# Patient Record
Sex: Female | Born: 1935
Health system: Southern US, Community
[De-identification: ages and names within clinical notes are randomized; demographics above are authoritative.]

## PROBLEM LIST (undated history)

## (undated) DIAGNOSIS — I7300 Raynaud's syndrome without gangrene: Secondary | ICD-10-CM

## (undated) DIAGNOSIS — K219 Gastro-esophageal reflux disease without esophagitis: Secondary | ICD-10-CM

## (undated) DIAGNOSIS — R911 Solitary pulmonary nodule: Secondary | ICD-10-CM

## (undated) DIAGNOSIS — R55 Syncope and collapse: Secondary | ICD-10-CM

## (undated) DIAGNOSIS — G4733 Obstructive sleep apnea (adult) (pediatric): Secondary | ICD-10-CM

## (undated) DIAGNOSIS — I10 Essential (primary) hypertension: Secondary | ICD-10-CM

## (undated) DIAGNOSIS — E785 Hyperlipidemia, unspecified: Secondary | ICD-10-CM

## (undated) HISTORY — DX: Syncope and collapse: R55

## (undated) HISTORY — PX: ABDOMINAL HYSTERECTOMY: SHX81

## (undated) HISTORY — DX: Essential (primary) hypertension: I10

## (undated) HISTORY — DX: Gastro-esophageal reflux disease without esophagitis: K21.9

## (undated) HISTORY — PX: APPENDECTOMY: SHX54

## (undated) HISTORY — PX: ESOPHAGEAL DILATION: SHX303

## (undated) HISTORY — DX: Hyperlipidemia, unspecified: E78.5

## (undated) HISTORY — DX: Raynaud's syndrome without gangrene: I73.00

## (undated) HISTORY — DX: Solitary pulmonary nodule: R91.1

## (undated) HISTORY — DX: Obstructive sleep apnea (adult) (pediatric): G47.33

## (undated) HISTORY — PX: COLON SURGERY: SHX602

---
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
## 1959-05-02 HISTORY — PX: HEMORRHOID SURGERY: SHX153

## 1968-05-01 HISTORY — PX: KNEE ARTHROSCOPY: SUR90

## 1986-05-01 HISTORY — PX: BREAST BIOPSY: SHX20

## 1999-05-02 HISTORY — PX: UVULOPALATOPHARYNGOPLASTY: SHX827

## 2004-06-28 ENCOUNTER — Ambulatory Visit: Payer: Self-pay | Admitting: Internal Medicine

## 2005-03-08 ENCOUNTER — Emergency Department: Payer: Self-pay | Admitting: Emergency Medicine

## 2005-05-01 HISTORY — PX: PATELLA FRACTURE SURGERY: SHX735

## 2005-06-07 ENCOUNTER — Ambulatory Visit: Payer: Self-pay | Admitting: Internal Medicine

## 2005-07-19 ENCOUNTER — Other Ambulatory Visit: Payer: Self-pay

## 2005-07-19 ENCOUNTER — Inpatient Hospital Stay: Payer: Self-pay | Admitting: Specialist

## 2005-07-24 ENCOUNTER — Other Ambulatory Visit: Payer: Self-pay

## 2005-10-04 ENCOUNTER — Ambulatory Visit: Payer: Self-pay | Admitting: Internal Medicine

## 2006-06-07 ENCOUNTER — Inpatient Hospital Stay: Payer: Self-pay | Admitting: Internal Medicine

## 2006-06-07 ENCOUNTER — Other Ambulatory Visit: Payer: Self-pay

## 2006-10-08 ENCOUNTER — Ambulatory Visit: Payer: Self-pay | Admitting: Internal Medicine

## 2007-01-10 ENCOUNTER — Ambulatory Visit: Payer: Self-pay | Admitting: Internal Medicine

## 2007-01-29 ENCOUNTER — Ambulatory Visit: Payer: Self-pay | Admitting: Gastroenterology

## 2007-07-12 ENCOUNTER — Encounter: Payer: Self-pay | Admitting: Internal Medicine

## 2007-10-10 ENCOUNTER — Encounter: Payer: Self-pay | Admitting: Internal Medicine

## 2007-10-10 ENCOUNTER — Ambulatory Visit: Payer: Self-pay | Admitting: Internal Medicine

## 2008-08-26 ENCOUNTER — Encounter: Payer: Self-pay | Admitting: Internal Medicine

## 2008-10-12 ENCOUNTER — Encounter: Payer: Self-pay | Admitting: Internal Medicine

## 2008-12-11 ENCOUNTER — Encounter: Payer: Self-pay | Admitting: Internal Medicine

## 2009-03-05 ENCOUNTER — Encounter: Payer: Self-pay | Admitting: Internal Medicine

## 2009-04-07 ENCOUNTER — Encounter: Payer: Self-pay | Admitting: Internal Medicine

## 2009-09-07 ENCOUNTER — Encounter: Payer: Self-pay | Admitting: Internal Medicine

## 2009-09-13 ENCOUNTER — Encounter: Payer: Self-pay | Admitting: Internal Medicine

## 2009-09-13 ENCOUNTER — Ambulatory Visit: Payer: Self-pay | Admitting: Internal Medicine

## 2009-10-08 ENCOUNTER — Ambulatory Visit: Payer: Self-pay | Admitting: Internal Medicine

## 2009-10-08 DIAGNOSIS — R42 Dizziness and giddiness: Secondary | ICD-10-CM | POA: Insufficient documentation

## 2009-10-08 DIAGNOSIS — I10 Essential (primary) hypertension: Secondary | ICD-10-CM

## 2009-10-08 DIAGNOSIS — K219 Gastro-esophageal reflux disease without esophagitis: Secondary | ICD-10-CM | POA: Insufficient documentation

## 2009-10-08 DIAGNOSIS — M810 Age-related osteoporosis without current pathological fracture: Secondary | ICD-10-CM | POA: Insufficient documentation

## 2009-10-27 ENCOUNTER — Encounter: Payer: Self-pay | Admitting: Internal Medicine

## 2009-11-08 ENCOUNTER — Ambulatory Visit: Payer: Self-pay | Admitting: Internal Medicine

## 2009-11-10 LAB — CONVERTED CEMR LAB
ALT: 20 units/L (ref 0–35)
AST: 25 units/L (ref 0–37)
Albumin: 4.3 g/dL (ref 3.5–5.2)
Alkaline Phosphatase: 66 units/L (ref 39–117)
BUN: 20 mg/dL (ref 6–23)
Basophils Absolute: 0 10*3/uL (ref 0.0–0.1)
Basophils Relative: 0.4 % (ref 0.0–3.0)
Bilirubin, Direct: 0.1 mg/dL (ref 0.0–0.3)
CO2: 29 meq/L (ref 19–32)
Calcium: 9.7 mg/dL (ref 8.4–10.5)
Chloride: 107 meq/L (ref 96–112)
Creatinine, Ser: 0.6 mg/dL (ref 0.4–1.2)
Eosinophils Absolute: 0.1 10*3/uL (ref 0.0–0.7)
Eosinophils Relative: 1.9 % (ref 0.0–5.0)
GFR calc non Af Amer: 103.88 mL/min (ref >60)
Glucose, Bld: 82 mg/dL (ref 70–99)
HCT: 40.9 % (ref 36.0–46.0)
Hemoglobin: 14.1 g/dL (ref 12.0–15.0)
Lymphocytes Relative: 16.1 % (ref 12.0–46.0)
Lymphs Abs: 1 10*3/uL (ref 0.7–4.0)
MCHC: 34.4 g/dL (ref 30.0–36.0)
MCV: 95.8 fL (ref 78.0–100.0)
Monocytes Absolute: 0.5 10*3/uL (ref 0.1–1.0)
Monocytes Relative: 8.5 % (ref 3.0–12.0)
Neutro Abs: 4.4 10*3/uL (ref 1.4–7.7)
Neutrophils Relative %: 73.1 % (ref 43.0–77.0)
Phosphorus: 3.2 mg/dL (ref 2.3–4.6)
Platelets: 217 10*3/uL (ref 150.0–400.0)
Potassium: 4.9 meq/L (ref 3.5–5.1)
RBC: 4.27 M/uL (ref 3.87–5.11)
RDW: 13.6 % (ref 11.5–14.6)
Sodium: 142 meq/L (ref 135–145)
TSH: 0.6 microintl units/mL (ref 0.35–5.50)
Total Bilirubin: 0.7 mg/dL (ref 0.3–1.2)
Total Protein: 6.2 g/dL (ref 6.0–8.3)
WBC: 6 10*3/uL (ref 4.5–10.5)

## 2009-12-25 ENCOUNTER — Emergency Department: Payer: Self-pay | Admitting: Emergency Medicine

## 2009-12-27 ENCOUNTER — Encounter: Payer: Self-pay | Admitting: Internal Medicine

## 2009-12-27 ENCOUNTER — Telehealth: Payer: Self-pay | Admitting: Internal Medicine

## 2010-03-28 ENCOUNTER — Ambulatory Visit: Payer: Self-pay | Admitting: Internal Medicine

## 2010-03-28 DIAGNOSIS — L299 Pruritus, unspecified: Secondary | ICD-10-CM | POA: Insufficient documentation

## 2010-05-11 ENCOUNTER — Other Ambulatory Visit: Payer: Self-pay | Admitting: Internal Medicine

## 2010-05-11 ENCOUNTER — Ambulatory Visit
Admission: RE | Admit: 2010-05-11 | Discharge: 2010-05-11 | Payer: Self-pay | Source: Home / Self Care | Attending: Internal Medicine | Admitting: Internal Medicine

## 2010-05-11 DIAGNOSIS — R413 Other amnesia: Secondary | ICD-10-CM | POA: Insufficient documentation

## 2010-05-11 LAB — HEPATIC FUNCTION PANEL
ALT: 23 U/L (ref 0–35)
AST: 27 U/L (ref 0–37)
Albumin: 4 g/dL (ref 3.5–5.2)
Alkaline Phosphatase: 68 U/L (ref 39–117)
Bilirubin, Direct: 0.1 mg/dL (ref 0.0–0.3)
Total Bilirubin: 0.7 mg/dL (ref 0.3–1.2)
Total Protein: 5.9 g/dL — ABNORMAL LOW (ref 6.0–8.3)

## 2010-05-11 LAB — RENAL FUNCTION PANEL
Albumin: 4 g/dL (ref 3.5–5.2)
BUN: 17 mg/dL (ref 6–23)
CO2: 29 mEq/L (ref 19–32)
Calcium: 9.8 mg/dL (ref 8.4–10.5)
Chloride: 106 mEq/L (ref 96–112)
Creatinine, Ser: 0.6 mg/dL (ref 0.4–1.2)
GFR: 110.19 mL/min (ref >60.00)
Glucose, Bld: 93 mg/dL (ref 70–99)
Phosphorus: 3.1 mg/dL (ref 2.3–4.6)
Potassium: 4.2 mEq/L (ref 3.5–5.1)
Sodium: 143 mEq/L (ref 135–145)

## 2010-05-11 LAB — CBC WITH DIFFERENTIAL/PLATELET
Basophils Absolute: 0 10*3/uL (ref 0.0–0.1)
Basophils Relative: 0.1 % (ref 0.0–3.0)
Eosinophils Absolute: 0.1 10*3/uL (ref 0.0–0.7)
Eosinophils Relative: 1.4 % (ref 0.0–5.0)
HCT: 41.5 % (ref 36.0–46.0)
Hemoglobin: 14 g/dL (ref 12.0–15.0)
Lymphocytes Relative: 10.1 % — ABNORMAL LOW (ref 12.0–46.0)
Lymphs Abs: 0.7 10*3/uL (ref 0.7–4.0)
MCHC: 33.7 g/dL (ref 30.0–36.0)
MCV: 96.9 fl (ref 78.0–100.0)
Monocytes Absolute: 0.6 10*3/uL (ref 0.1–1.0)
Monocytes Relative: 9.1 % (ref 3.0–12.0)
Neutro Abs: 5.4 10*3/uL (ref 1.4–7.7)
Neutrophils Relative %: 79.3 % — ABNORMAL HIGH (ref 43.0–77.0)
Platelets: 197 10*3/uL (ref 150.0–400.0)
RBC: 4.28 Mil/uL (ref 3.87–5.11)
RDW: 13.3 % (ref 11.5–14.6)
WBC: 6.8 10*3/uL (ref 4.5–10.5)

## 2010-05-11 LAB — TSH: TSH: 0.83 u[IU]/mL (ref 0.35–5.50)

## 2010-05-11 LAB — VITAMIN B12: Vitamin B-12: 777 pg/mL (ref 211–911)

## 2010-05-16 ENCOUNTER — Telehealth: Payer: Self-pay | Admitting: Internal Medicine

## 2010-05-31 NOTE — Letter (Signed)
Summary: [facility] Card[facility]  [facility]   Imported By: Lanelle Bal 12/07/2009 11:53:19  _____________________________________________________________________  External Attachment:    Type:   Image     Comment:   External Document  Appended Document: [facility]iology stable MVP with moderate to severe regurg

## 2010-05-31 NOTE — Assessment & Plan Note (Signed)
Summary: RASH ON ABDOMEN/DLO   Vital Signs:  Patient profile:   77 year old female Weight:      104 pounds Temp:     98.3 degrees F oral BP sitting:   118 / 60  (left arm) Cuff size:   regular  Vitals Entered By: Mervin Hack CMA [name]) (March 28, 2010 4:36 PM) CC: rash   History of Present Illness: Has rash on abdomen started almost a week ago seems to be spreading very itchy tried [place] cream  has also noted some mucus in throat--though doesn't really feel sick tried mucinex for this---noted rash after this after several days stopped the mucinex and tried claritin at suggestion of [facility]  No new clothes no new detergents  Allergies: 1)  Vioxx  Past History:  Past medical, surgical, family and social histories (including risk factors) reviewed for relevance to current acute and chronic problems.  Past Medical History: Reviewed history from 10/08/2009 and no changes required. GERD Hyperlipidemia Hypertension Osteoporosis ??TIAs --dizziness and presyncope Raynaud's phenomenon Migraine headaches--remitted now Chronic left lung nodule Obstructive sleep apnea-- UPPP done  Past Surgical History: Reviewed history from 10/08/2009 and no changes required. Appendectomy--1961 Hemorrhoidectomy--1961 Hysterectomy-- 1961 Tonsillectomy and adenoidectomy  1936 Benign breast biopsy-- 1988 Benigh ovarian tumor--1958 Right knee arthroscopy--1970 Esophageal dilation   UPPP for sleep apnea 2001 Repair of fractured right patella--2007 GI bleed due to diverticulosis/AVMs--colonoscopy 2008  Family History: Reviewed history from 10/08/2009 and no changes required. Mom died of CHF @79 . Had DM Dad committed suicide @59  1 brother living 1 sister died of Altzheimers @85  1 brother died of heart and valve trouble  ~65 No breast or colon cancer  Social History: Reviewed history from 10/08/2009 and no changes required. Widowed 1999 and 48 years of  marriage  Remarried 2007 2 children--1 son, 1 daughter Retired Charity fundraiser-- various spots, mostly doctor's offices Never Smoked Alcohol use-no. Occ wine in distant past  Has living will. Son Roney Jaffe is her health care POA. Has DNR order and wants to continue it Would not accept tube feeds  Review of Systems       no allergy problems  No fever hasn't felt sick  Physical Exam  General:  alert and normal appearance.   Nose:  mild pale congestion no inflammation Mouth:  no erythema and no exudates.   Neck:  supple and no cervical lymphadenopathy.   Skin:  no sig rash--slight redness vaguely across lower abd and into groin but no distinct rash   Impression & Recommendations:  Problem # 1:  PRURITUS (ICD-698.9) Assessment New no distinct rash but slightly red not clear if this could be related to the mucinex  will try cetirizine hydrocort cream  Complete Medication List: 1)  Aspirin 325 Mg Tabs (Aspirin) .Marland Kitchen.. 1 tab daily 2)  Omeprazole 20 Mg Tbec (Omeprazole) .... Take 1 by mouth once daily 3)  Citracal/vitamin D 250-200 Mg-unit Tabs (Calcium citrate-vitamin d) .... Take 2 by mouth two times a day 4)  Juice Plus Fibre Liqd (Nutritional supplements) .... Take 1 by mouth once daily 5)  Red Yeast Rice 600 Mg Caps (Red yeast rice extract) .... Take 1 by mouth once daily 6)  Hydrocortisone 2.5 % Crea (Hydrocortisone) .... Apply three times a day to itchy area as needed  Patient Instructions: 1)  Please try cetirizine 10mg  daily or loratadine 10mg  1-2 daily for the itching 2)  Please schedule a follow-up appointment as needed .  Prescriptions: HYDROCORTISONE 2.5 %  CREA (HYDROCORTISONE) apply three times a day to itchy area as needed  #60gm x 1   Entered and Authorized by:   [name] MD   Signed by:   [name] MD on 03/28/2010   Method used:   Electronically to        [facility]  [address] (retail)       [address], [address]       [place], [place]  [address]       Ph: [phone]       Fax: [phone]   RxID:   [id number]    Orders Added: 1)  Est. Patient Level III [10272]    Current Allergies (reviewed today): VIOXX

## 2010-05-31 NOTE — Assessment & Plan Note (Signed)
Summary: ONE MONTH FOLLOW UP / LFW   Vital Signs:  Patient profile:   77 year old female Weight:      104 pounds Temp:     97.0 degrees F tympanic Pulse rate:   64 / minute Pulse rhythm:   regular BP sitting:   130 / 70  (left arm) Cuff size:   regular  Vitals Entered By: Mervin Hack CMA [name]) (November 08, 2009 10:10 AM) CC: 1 month follow-up   History of Present Illness: Doing well Still having dizziness--"aggravating" Able to walk  on the circular track at [facility] Uses walker for stability  No vertigo Has trouble focusing--needs to cover an eye to read Eye exam by Dr Karma Greaser obvious problems  has tried meclizine for up to 10 days two times a day and no improvement    Allergies: 1)  Vioxx  Past History:  Past medical, surgical, family and social histories (including risk factors) reviewed for relevance to current acute and chronic problems.  Past Medical History: Reviewed history from 10/08/2009 and no changes required. GERD Hyperlipidemia Hypertension Osteoporosis ??TIAs --dizziness and presyncope Raynaud's phenomenon Migraine headaches--remitted now Chronic left lung nodule Obstructive sleep apnea-- UPPP done  Past Surgical History: Reviewed history from 10/08/2009 and no changes required. Appendectomy--1961 Hemorrhoidectomy--1961 Hysterectomy-- 1961 Tonsillectomy and adenoidectomy  1936 Benign breast biopsy-- 1988 Benigh ovarian tumor--1958 Right knee arthroscopy--1970 Esophageal dilation   UPPP for sleep apnea 2001 Repair of fractured right patella--2007 GI bleed due to diverticulosis/AVMs--colonoscopy 2008  Family History: Reviewed history from 10/08/2009 and no changes required. Mom died of CHF @79 . Had DM Dad committed suicide @59  1 brother living 1 sister died of Altzheimers @85  1 brother died of heart and valve trouble  ~65 No breast or colon cancer  Social History: Reviewed history from 10/08/2009 and no changes  required. Widowed 1999 and 48 years of marriage  Remarried 2007 2 children--1 son, 1 daughter Retired Charity fundraiser-- various spots, mostly doctor's offices Never Smoked Alcohol use-no. Occ wine in distant past  Has living will. Son Roney Jaffe is her health care POA. Has DNR order and wants to continue it Would not accept tube feeds  Review of Systems       appetite is usual for her Sleeping okay--- early to bed and often up  ~4AM Occ early afternoon nap weight stable  Physical Exam  General:  alert and normal appearance.   Neck:  supple, no masses, no thyromegaly, and no cervical lymphadenopathy.   Lungs:  normal respiratory effort and normal breath sounds.   Heart:  normal rate, regular rhythm, no murmur, and no gallop.   Extremities:  no edema Neurologic:  strength normal in all extremities, gait normal, and Romberg negative.   Psych:  normally interactive, good eye contact, not anxious appearing, and not depressed appearing.     Impression & Recommendations:  Problem # 1:  DIZZINESS (ICD-780.4) Assessment Unchanged continues despite no vertigo, likely to be vestibular uses walker for stability when needed  Problem # 2:  HYPERTENSION (ICD-401.9) Assessment: Unchanged  BP fine off meds  will stay off sees Dr Darrold Junker for valvular insufficiency  BP today: 130/70 Prior BP: 120/70 (10/08/2009)  Orders: TLB-Renal Function Panel (80069-RENAL) TLB-CBC Platelet - w/Differential (85025-CBCD) TLB-Hepatic/Liver Function Pnl (80076-HEPATIC) TLB-TSH (Thyroid Stimulating Hormone) (84443-TSH) Venipuncture (60454)  Complete Medication List: 1)  Aspirin 325 Mg Tabs (Aspirin) .Marland Kitchen.. 1 tab daily 2)  Omeprazole 20 Mg Tbec (Omeprazole) .... Take 1 by mouth once daily 3)  Citracal/vitamin D  250-200 Mg-unit Tabs (Calcium citrate-vitamin d) .... Take 2 by mouth two times a day 4)  Juice Plus Fibre Liqd (Nutritional supplements) .... Take 1 by mouth once daily 5)  Red Yeast Rice 600 Mg Caps  (Red yeast rice extract) .... Take 1 by mouth once daily  Patient Instructions: 1)  Please schedule a follow-up appointment in 6 months .   Current Allergies (reviewed today): VIOXX

## 2010-05-31 NOTE — Miscellaneous (Signed)
Summary: DO NOT RESUSCITATE ORDER  DO NOT RESUSCITATE ORDER   Imported By: Carin Primrose 10/08/2009 16:01:21  _____________________________________________________________________  External Attachment:    Type:   Image     Comment:   External Document

## 2010-05-31 NOTE — Letter (Signed)
Summary: Cardiology/[facility]  Cardiology/[facility]   Imported By: Sherian Rein 01/21/2010 09:47:08  _____________________________________________________________________  External Attachment:    Type:   Image     Comment:   External Document  Appended Document: Cardiology/[facility] checking stress test due to recurrent chest pain

## 2010-05-31 NOTE — Letter (Signed)
Summary: [facility]   Imported By: Lanelle Bal 10/14/2009 13:08:18  _____________________________________________________________________  External Attachment:    Type:   Image     Comment:   External Document

## 2010-05-31 NOTE — Letter (Signed)
Summary: Records Dated 07-12-07 thru 07-09-09/[facility]  Records Dated 07-12-07 thru 07-09-09/[facility]   Imported By: Lanelle Bal 10/14/2009 12:59:01  _____________________________________________________________________  External Attachment:    Type:   Image     Comment:   External Document

## 2010-05-31 NOTE — Letter (Signed)
Summary: Patient Questionnaire  Patient Questionnaire   Imported By: Beau Fanny 10/08/2009 16:19:56  _____________________________________________________________________  External Attachment:    Type:   Image     Comment:   External Document

## 2010-05-31 NOTE — Assessment & Plan Note (Signed)
Summary: NEW PT TO EST PER DR LETVAK/CLE   Vital Signs:  Patient profile:   77 year old female Height:      63 inches Weight:      103 pounds BMI:     18.31 Temp:     97.9 degrees F oral Pulse rate:   72 / minute Pulse rhythm:   regular BP sitting:   120 / 70  (left arm) Cuff size:   regular  Vitals Entered By: Mervin Hack CMA [name]) (October 08, 2009 2:28 PM) CC: new patient to establish care   History of Present Illness: Eatablishing here Had been seen at [facility] I care for her husband and have known her for some time She lives at [facility] for [facility] years---moved there with 1st husband.  HTN goes back many years controlled on meds no headaches Has had bad dizzy spells at times---hard to even sit up in bed Gets blurred vision sensation and doesn't feel right constant blurred sensation but eye exam completely normal no vertigo---trial of meclizine didn't help  Migraines in past  On plavix since possible TIAs in church no aspirin allergy though  given mirtazapine with stress of caring for demented husband also stress with daughter moving to [place] not clearly helping  history of hyperliipemia takes red yeast rice  Had dilation of esophagus suspicion of sleep problems due to reflux or esophageal problems uses the PPI reguarly  Preventive Screening-Counseling & Management  Alcohol-Tobacco     Smoking Status: never  Allergies (verified): 1)  Vioxx  Past History:  Past Medical History: GERD Hyperlipidemia Hypertension Osteoporosis ??TIAs --dizziness and presyncope Raynaud's phenomenon Migraine headaches--remitted now Chronic left lung nodule Obstructive sleep apnea-- UPPP done  Past Surgical History: Appendectomy--1961 Hemorrhoidectomy--1961 Hysterectomy-- 22-Sep-1959 Tonsillectomy and adenoidectomy  1936 Benign breast biopsy-- 09/22/1986 Benigh ovarian tumor--1958 Right knee arthroscopy--1970 Esophageal dilation   UPPP for sleep apnea  09-22-99 Repair of fractured right patella--2007 GI bleed due to diverticulosis/AVMs--colonoscopy 09-22-06  Family History: Mom died of CHF @79 . Had DM Dad committed suicide @59  1 brother living 1 sister died of Altzheimers @85  1 brother died of heart and valve trouble  ~65 No breast or colon cancer  Social History: Widowed 09/21/1997 and 48 years of marriage  Remarried 09/21/05 2 children--1 son, 1 daughter Retired Charity fundraiser-- various spots, mostly doctor's offices Never Smoked Alcohol use-no. Occ wine in distant past  Has living will. Son Roney Jaffe is her health care POA. Has DNR order and wants to continue it Would not accept tube feeds Smoking Status:  never  Review of Systems General:  weight stable wears seat belt generally sleeps okay--some recent stress and has early awakening Used to walk 3 miles per day--had to give up with husband's deterioration. Eyes:  Complains of blurring; denies double vision and vision loss-1 eye. ENT:  Complains of decreased hearing; uses aides teeth okay-- keeps up with the dentist. CV:  Complains of lightheadness; denies chest pain or discomfort, difficulty breathing at night, difficulty breathing while lying down, palpitations, and shortness of breath with exertion. Resp:  Denies cough and shortness of breath. GI:  Complains of indigestion; denies abdominal pain, bloody stools, change in bowel habits, dark tarry stools, nausea, and vomiting; eats prunes, lots of water to keep regular. GU:  Denies dysuria and incontinence. MS:  Denies joint pain and joint swelling. Derm:  Denies lesion(s) and rash. Neuro:  Denies headaches, numbness, tingling, and weakness. Psych:  Complains of anxiety and depression; some mood issues with  husband's deterioration not anhedonic. Heme:  Denies abnormal bruising and enlarge lymph nodes. Allergy:  Denies seasonal allergies and sneezing.  Physical Exam  General:  alert and normal appearance.   Eyes:  pupils equal, pupils round,  and pupils reactive to light.   Mouth:  no erythema and no exudates.   Neck:  supple, no masses, no thyromegaly, no carotid bruits, and no cervical lymphadenopathy.   Lungs:  normal respiratory effort and normal breath sounds.   Heart:  normal rate, regular rhythm, no murmur, and no gallop.   Abdomen:  soft, non-tender, and no masses.   Msk:  no joint tenderness and no joint swelling.   Pulses:  faint in feet Extremities:  no edema Neurologic:  alert & oriented X3, strength normal in all extremities, and gait normal.   Skin:  no rashes and no suspicious lesions.   Axillary Nodes:  No palpable lymphadenopathy Psych:  normally interactive, good eye contact, not anxious appearing, and not depressed appearing.     Impression & Recommendations:  Problem # 1:  DIZZINESS (ICD-780.4) Assessment New and vision changes eye exam normal history of TIA's is quite questionable  will stop the plavix and use aspirin reduce meds --esp BP and reevaluate Just had full blood panel in May and all fine (records reviewed from Dr Judithann Sheen)  Problem # 2:  HYPERTENSION (ICD-401.9) Assessment: Unchanged running low will try off--esp in view of her not feeling right  The following medications were removed from the medication list:    Amlodipine Besylate 5 Mg Tabs (Amlodipine besylate) .Marland Kitchen... Take 1 by mouth once daily  BP today: 120/70  Problem # 3:  OSTEOPOROSIS (ICD-733.00) Assessment: Comment Only spine only osteopenia in hip and wrist will stop the alendronate--on for >5 years  Her updated medication list for this problem includes:    Citracal/vitamin D 250-200 Mg-unit Tabs (Calcium citrate-vitamin d) .Marland Kitchen... Take 2 by mouth two times a day  Problem # 4:  GERD (ICD-530.81) Assessment: Unchanged will continue PPI since she needed dilation in past  Her updated medication list for this problem includes:    Omeprazole 20 Mg Tbec (Omeprazole) .Marland Kitchen... Take 1 by mouth once daily  Problem # 5:   HYPERLIPIDEMIA (ICD-272.4) Assessment: Unchanged on red yeast rice Total 279 before starting this  Complete Medication List: 1)  Aspirin 325 Mg Tabs (Aspirin) .Marland Kitchen.. 1 tab daily 2)  Omeprazole 20 Mg Tbec (Omeprazole) .... Take 1 by mouth once daily 3)  Citracal/vitamin D 250-200 Mg-unit Tabs (Calcium citrate-vitamin d) .... Take 2 by mouth two times a day 4)  Juice Plus Fibre Liqd (Nutritional supplements) .... Take 1 by mouth once daily 5)  Red Yeast Rice 600 Mg Caps (Red yeast rice extract) .... Take 1 by mouth once daily  Patient Instructions: 1)  Please schedule a follow-up appointment in 1 month.   Current Allergies (reviewed today): VIOXX    Preventive Care Screening  Mammogram:    Date:  10/02/2008    Results:  normal     Tetanus/Td Immunization History:    Tetanus/Td # 1:  Historical (07/19/2005)  Pneumovax Immunization History:    Pneumovax # 1:  Pneumovax (Medicare) (05/01/1994)   Appended Document: NEW PT TO EST PER DR LETVAK/CLE    Clinical Lists Changes  Observations: Added new observation of ZOSTAVAX: Zostavax (12/11/2008 15:58)       Immunization History:  Tetanus/Td Immunization History:    Tetanus/Td:  Historical (07/19/2005)  Pneumovax Immunization History:    Pneumovax:  Pneumovax (Medicare) (05/01/1994)  Zostavax History:    Zostavax # 1:  Zostavax (12/11/2008)  Appended Document: NEW PT TO EST PER DR LETVAK/CLE concerns about memory loss also MRI just done by Dr Georgiann Hahn chronic white matter changes Blood work all fine probably related to stress but could be mild cognitive impairment will reevaluate at next visit

## 2010-05-31 NOTE — Miscellaneous (Signed)
Summary: Shingles/[facility]  [facility]les[facility]   Imported By: Lanelle Bal 10/14/2009 13:06:46  _____________________________________________________________________  External Attachment:    Type:   Image     Comment:   External Document

## 2010-05-31 NOTE — Progress Notes (Signed)
Summary: call a nurse   Phone Note Call from Patient   Summary of Call: Triage Record Num: [id number] Operator: Meribeth Mattes Patient Name: Holly Koch Call Date & Time: 12/25/2009 7:53:18AM Patient Phone: [phone] PCP: Patient Gender: Female PCP Fax : Patient DOB: 1926-07-24 Practice Name: [facility] Reason for Call: For several days pt has been having discomfort under her left breast. Pt says that her bra feels too tight. Pt is having discomfort under breast now. Pain woke her up during the night, no diff breathing. RN advised pt to call EMS 911. Pt verbalized understanding. Protocol(s) Used: Chest Pain Recommended Outcome per Protocol: Activate EMS 911 Reason for Outcome: Pressure, fullness, squeezing sensation or pain anywhere in the chest lasting 5 or more minutes now or within the last hour. Pain is NOT associated with taking a deep breath or a productive cough, movement, or touch to a localized area on the chest. Care Advice:  ~ An adult should stay with the patient, preferably one trained in CPR. 12/25/2009 8:07:46AM Page 1 of 1 Initial call taken by: [name],  December 27, 2009 8:36 AM  Follow-up for Phone Call        Please check on her [name] MD  December 27, 2009 10:13 AM   pt states she's doing pretty good, she has appt today at 3pm with Dr.[name]. Pt states the pain comes and goes, but she's ok right now, pt states she's been "out and about" as much as possible. [name] CMA [name])  December 27, 2009 1:20 PM   Yes, her husband just died last week Follow-up by: [name] MD,  December 27, 2009 1:35 PM

## 2010-05-31 NOTE — Miscellaneous (Signed)
Summary: Do Not Resuscitate Order  Do Not Resuscitate Order   Imported By: Beau Fanny 10/11/2009 09:44:49  _____________________________________________________________________  External Attachment:    Type:   Image     Comment:   External Document

## 2010-06-02 NOTE — Progress Notes (Signed)
Summary: chest cold   Phone Note Call from Patient Call back at Home Phone [phone]   Caller: [name] with [facility]  Call For: [name] MD Summary of Call: [name] says that patient camed in to the clinic this morning complaining of a chest cold. [name] says that her lungs sound clear, has some post nasal drip, watery eyes, cough, 97.7, she is currently  takeing zyrtec daily. Patient wants to know  what she can take in addition to the zyrtec. Call patient directly at home number. Uses [facility] [address].  Initial call taken by: [name],  May 16, 2010 2:44 PM  Follow-up for Phone Call        I just recommend tylenol for the aches, etc No other meds are clearly useful for a cold If she worsens, we can get her in for a check up but doesn't sound necessary now Follow-up by: [name] MD,  May 16, 2010 5:24 PM  Additional Follow-up for Phone Call Additional follow up Details #1::        Spoke with patient and advised results.  Additional Follow-up by: Mervin Hack CMA [name]),  May 16, 2010 5:31 PM

## 2010-06-02 NOTE — Assessment & Plan Note (Signed)
Summary: FOLLOW UP / LFW   Vital Signs:  Patient profile:   77 year old female Weight:      104 pounds Temp:     98.0 degrees F oral Pulse rate:   68 / minute Pulse rhythm:   regular BP sitting:   120 / 60  (left arm) Cuff size:   regular  Vitals Entered By: Mervin Hack CMA [name]) (May 11, 2010 11:32 AM) CC: 6 month follow-up   History of Present Illness: Got the time mixed up for appt She is concerned that her memory is poor Forgets names and trouble with a variety of things Forgot date in January of son's anniversary  Still drives---never has gotten lost Does her finances--did forget 1 insurance premium, had misplaced the bill Trouble reading book---can't focus well  Still with some lightheadedness Walks okay--does 2 miles on track every morning Uses walker for stability No incontinence  No heart problems recent stress test by Dr Darrold Junker was okay He monitors leaky heart valve  Allergies: 1)  Vioxx  Past History:  Past medical, surgical, family and social histories (including risk factors) reviewed for relevance to current acute and chronic problems.  Past Medical History: Reviewed history from 10/08/2009 and no changes required. GERD Hyperlipidemia Hypertension Osteoporosis ??TIAs --dizziness and presyncope Raynaud's phenomenon Migraine headaches--remitted now Chronic left lung nodule Obstructive sleep apnea-- UPPP done  Past Surgical History: Reviewed history from 10/08/2009 and no changes required. Appendectomy--1961 Hemorrhoidectomy--1961 Hysterectomy-- 1961 Tonsillectomy and adenoidectomy  1936 Benign breast biopsy-- 1988 Benigh ovarian tumor--1958 Right knee arthroscopy--1970 Esophageal dilation   UPPP for sleep apnea 2001 Repair of fractured right patella--2007 GI bleed due to diverticulosis/AVMs--colonoscopy 2008  Family History: Reviewed history from 10/08/2009 and no changes required. Mom died of CHF @79 . Had DM Dad  committed suicide @59  1 brother living 1 sister died of Altzheimers @85  1 brother died of heart and valve trouble  ~65 No breast or colon cancer  Social History: Reviewed history from 10/08/2009 and no changes required. Widowed 1999 and 48 years of marriage  Remarried 2007 2 children--1 son, 1 daughter Retired Charity fundraiser-- various spots, mostly doctor's offices Never Smoked Alcohol use-no. Occ wine in distant past  Has living will. Son Roney Jaffe is her health care POA. Has DNR order and wants to continue it Would not accept tube feeds  Review of Systems       appetite is fine sleeps okay--tends to go to bed early and initiates well. Often awakens 1-3AM weight is stable  Physical Exam  General:  alert and normal appearance.   Neck:  supple, no masses, no thyromegaly, no carotid bruits, and no cervical lymphadenopathy.   Lungs:  normal respiratory effort, no intercostal retractions, no accessory muscle use, and normal breath sounds.   Heart:  normal rate, regular rhythm, no murmur, and no gallop.   Abdomen:  soft and non-tender.   Extremities:  no edema Neurologic:  alert & oriented X3.   Clock draw shows circle with 900 written with line at left side (I asked her to indicate 9 o'clock on it) Recall  3/3 at about 3 minutes President "Obama, ...." Psych:  normally interactive, good eye contact, not anxious appearing, and not depressed appearing.     Impression & Recommendations:  Problem # 1:  MEMORY LOSS (ICD-780.93) Assessment New  sounds like she has mild cognitive impairment recent brain MRI due to dizziness was okay will check labs closer follow up  I did approve her participation in  Wellness Program at [facility]  Orders: Venipuncture [phone]) TLB-Renal Function Panel (80069-RENAL) TLB-CBC Platelet - w/Differential (85025-CBCD) TLB-Hepatic/Liver Function Pnl (80076-HEPATIC) TLB-TSH (Thyroid Stimulating Hormone) (84443-TSH) TLB-B12, Serum-Total ONLY  (60454-U98)  Problem # 2:  HYPERTENSION (ICD-401.9) Assessment: Unchanged BP is fine  BP today: 120/60 Prior BP: 118/60 (03/28/2010)  Labs Reviewed: K+: 4.9 (11/08/2009) Creat: : 0.6 (11/08/2009)     Complete Medication List: 1)  Aspirin 325 Mg Tabs (Aspirin) .Marland Kitchen.. 1 tab daily 2)  Omeprazole 20 Mg Tbec (Omeprazole) .... Take 1 by mouth once daily 3)  Citracal/vitamin D 250-200 Mg-unit Tabs (Calcium citrate-vitamin d) .... Take 2 by mouth two times a day 4)  Juice Plus Fibre Liqd (Nutritional supplements) .... Take 1 by mouth once daily 5)  Red Yeast Rice 600 Mg Caps (Red yeast rice extract) .... Take 1 by mouth once daily 6)  Zyrtec Allergy 10 Mg Tabs (Cetirizine hcl) .... Two times a day  Patient Instructions: 1)  Please schedule a follow-up appointment in 3 months .    Orders Added: 1)  Venipuncture [36415] 2)  TLB-Renal Function Panel [80069-RENAL] 3)  TLB-CBC Platelet - w/Differential [85025-CBCD] 4)  TLB-Hepatic/Liver Function Pnl [80076-HEPATIC] 5)  TLB-TSH (Thyroid Stimulating Hormone) [84443-TSH] 6)  TLB-B12, Serum-Total ONLY [82607-B12] 7)  Est. Patient Level IV [11914]    Current Allergies (reviewed today): VIOXX

## 2010-06-02 NOTE — Miscellaneous (Signed)
Summary: Exercise Clearance Form/[facility]  Exercise Clearance Form/[facility]   Imported By: Lanelle Bal 05/18/2010 11:12:36  _____________________________________________________________________  External Attachment:    Type:   Image     Comment:   External Document

## 2010-06-09 ENCOUNTER — Telehealth: Payer: Self-pay | Admitting: Internal Medicine

## 2010-06-13 ENCOUNTER — Telehealth: Payer: Self-pay | Admitting: Internal Medicine

## 2010-06-16 NOTE — Progress Notes (Signed)
Summary: pt requests phone call  Phone Note Call from Patient Call back at Home Phone [phone]   Caller: Patient Call For: [name] MD Summary of Call: Pt is concerned about her memory loss.  She thinks that she needs to be taking something for this.  She has dicussed this with you before.  [facility]ses [facility] [address] and is asking that you call her to discuss. Initial call taken by: Lowella Petties CMA, AAMA,  June 09, 2010 10:09 AM  Follow-up for Phone Call        feels memory is worse---"like I had a surge in the wrong direction" Discussed uncertainty of Rx with mild cognitive impairment  will do trial on donepezil----keep April appt  Put name on list for assisted living Follow-up by: [name] MD,  June 09, 2010 1:19 PM    New/Updated Medications: DONEPEZIL HCL 5 MG TABS (DONEPEZIL HCL) 1 tab daily for memory problems Prescriptions: DONEPEZIL HCL 5 MG TABS (DONEPEZIL HCL) 1 tab daily for memory problems  #30 x 11   Entered and Authorized by:   [name] MD   Signed by:   [name] MD on 06/09/2010   Method used:   [facility]ronically to        [facility] [address])       [address], [address]       [place], [place]  [address]       Ph: [phone]       Fax: [phone]   RxID:   [id number]

## 2010-06-21 ENCOUNTER — Encounter: Payer: Self-pay | Admitting: Internal Medicine

## 2010-06-22 NOTE — Progress Notes (Signed)
Summary: call a nurse  Phone Note Call from Patient   Call For: [name] MD Summary of Call: Triage Record Num: [id number] Operator: Tomasita Crumble Patient Name: Holly Koch Call Date & Time: 06/12/2010 4:11:32PM Patient Phone: [phone] PCP: Tillman Abide Patient Gender: Female PCP Fax : [phone] Patient DOB: November 22, 1926 Practice Name: [facility] Reason for Call: [name] calling. States she feels she has a reaction to Donepezil Hcl 5 mg daily for memory. Took dose on 2/10, vomited once on 2/11. Took dose on 2/11 without further incidence of vomiting. No diarrhea or other sx. reported. Advised one time vomiting is not clearly related to to medication. Information noted and sent to office per Nausea or Vomiting protocol. Protocol(s) Used: Nausea or Vomiting Recommended Outcome per Protocol: Provide Home/Self Care Reason for Outcome: All other situations Care Advice: Nausea Care Advice: - Drink small amounts of clear, sweetened liquids or ice cold drinks. - Eat light, bland foods such as saltine crackers or plain bread. - Do not eat high fat, highly seasoned, high fiber, or high sugar content foods. - Avoid mixing hot food and cold foods. - Eat smaller, more frequent meals. - Rest as much as possible in a sitting or in a propped lying position. Do not lie flat for at least 2 hours after eating. - Do not take pain medication (such as aspirin, NSAIDs) while nauseated. - Rest as much as possible until symptoms improve since activity may worsen nausea.  ~ Go to the ED if you have developed signs and symptoms of dehydration such as very dry mouth and tongue; increased pulse rate at rest; no urine output for 8 hours or more; increasing weakness or drowsiness, or lightheadedness when trying to sit upright or standing.  ~ Vomiting Care Advice: - Do not eat solid foods until vomiting subsides. - Begin taking fluids by sucking on ice chips or popsicles or taking  sips of cool clear, nonprescription oral rehydration solution). - Gradually drink larger amounts of these fluids so that you are drinking six to eight 8 oz. (.2 li Initial call taken by: [name],  June 13, 2010 8:45 AM  Follow-up for Phone Call        Please let her know to continue the medication as long as she doesn't have further vomiting [name] MD  June 13, 2010 10:13 AM   spoke with patient and she states she feels much better, she eating soup and drinking fluids, no nausea. Pt wanted to let us know how much she appreciates the call-a-nurse. [name] CMA [name])  June 13, 2010 12:15 PM   Good to hear Follow-up by: [name] MD,  June 13, 2010 1:42 PM

## 2010-07-01 ENCOUNTER — Telehealth: Payer: Self-pay | Admitting: Internal Medicine

## 2010-07-07 NOTE — Progress Notes (Signed)
Summary: leg cramps   Phone Note Call from Patient Call back at Home Phone [phone]   Caller: Patient Call For: [name] MD Summary of Call: Patient says that she has been having terrible leg cramps during the night, it was especially bad last night. She has been using a heating pad, but it only helps a little. She is asking if there is something you could call in for her that would help her with the cramping. Please advise. Uses [facility] [address].  Initial call taken by: [name],  July 01, 2010 9:13 AM  Follow-up for Phone Call        can try methocarbamol 500mg  1 at bedtime as needed for cramps  Does she think the memory medicine could be causing?? Claudie Rathbone Dia Crawford MD  July 01, 2010 10:24 AM   Spoke with patient and advised results. Rx sent to pharmacy. Follow-up by: Mervin Hack CMA [name]),  July 01, 2010 3:44 PM    New/Updated Medications: METHOCARBAMOL 500 MG TABS (METHOCARBAMOL) 1 at bedtime as needed for cramps Prescriptions: METHOCARBAMOL 500 MG TABS (METHOCARBAMOL) 1 at bedtime as needed for cramps  #30 x 0   Entered by:   Mervin Hack CMA (AAMA)   Authorized by:   [name] MD   Signed by:   Mervin Hack CMA (AAMA) on 07/01/2010   Method used:   Electronically to        [facility] [address])       [address], [address]       [place], [place]  [address]       Ph: [phone]       Fax: [phone]   RxID:   [id number]

## 2010-08-10 ENCOUNTER — Ambulatory Visit (INDEPENDENT_AMBULATORY_CARE_PROVIDER_SITE_OTHER): Payer: Medicare Other | Admitting: Internal Medicine

## 2010-08-10 ENCOUNTER — Encounter: Payer: Self-pay | Admitting: Internal Medicine

## 2010-08-10 VITALS — BP 110/60 | HR 74 | Temp 98.2°F | Ht 63.0 in | Wt 103.0 lb

## 2010-08-10 DIAGNOSIS — G3184 Mild cognitive impairment, so stated: Secondary | ICD-10-CM

## 2010-08-10 DIAGNOSIS — F0280 Dementia in other diseases classified elsewhere without behavioral disturbance: Secondary | ICD-10-CM | POA: Insufficient documentation

## 2010-08-10 MED ORDER — DONEPEZIL HCL 10 MG PO TABS: 10.0000 mg | ORAL_TABLET | Freq: Every day | ORAL | Status: DC

## 2010-08-10 NOTE — Progress Notes (Signed)
  Subjective:    Patient ID: Holly Koch, female    DOB: 03-26-1927, 77 y.o.   MRN: [id number]  HPI Has been taking the donepezil Still has some memory issues but "I seem to be holding my own" She has piece of mind with this No stomach trouble or vomiting (like happened with the first dose) Seemed to have a bad bug though and this resolved  [facility] Notes pulling sensation in left leg--has to stop during walk and rest it by holding it up for 10-15 seconds Discussed trying heat rub before walking  Past Medical History  Diagnosis Date  . GERD (gastroesophageal reflux disease)   . Hyperlipidemia   . Hypertension   . Osteoporosis   . Pre-syncope   . Raynaud's phenomenon   . Migraine   . Lung nodule     chronic, left  . OSA (obstructive sleep apnea)     Past Surgical History  Procedure Date  . Appendectomy   . Hemorrhoid surgery 1961  . Abdominal hysterectomy   . Tonsillectomy and adenoidectomy 1936  . Breast biopsy 1988    benign  . Tumor excision 1958    benign tumor  . Knee arthroscopy 1970    right knee  . Esophageal dilation   . Uvulopalatopharyngoplasty 2001    for sleep apnea  . Patella fracture surgery 2007    repair of fractured right patella  . Colon surgery     Family History  Problem Relation Age of Onset  . Heart disease Mother   . Diabetes Mother   . Alzheimer's disease Sister   . Heart disease Brother     History   Social History  . Marital Status: Married    Spouse Name: N/A    Number of Children: 2  . Years of Education: N/A   Occupational History  . retired Charity fundraiser    Social History Main Topics  . Smoking status: Never Smoker   . Smokeless tobacco: Not on file  . Alcohol Use: No  . Drug Use: Not on file  . Sexually Active: Not on file   Other Topics Concern  . Not on file   Social History Narrative   Has living will, son Roney Jaffe is her healthcare POA. Has DNR order and wants to continue it. Would  not accept tube feeds   Review of Systems Sleeps well but has early awakening by 2-3AM and can't get back to sleep Will get up and read Iniitiates sleep ~10AM Occ gets back to sleep or naps in day Not new since on donepezil  Appetite is fine Weight stable     Objective:   Physical Exam  Constitutional: She is oriented to person, place, and time. She appears well-developed and well-nourished. No distress.  Neurological: She is alert and oriented to person, place, and time.  Psychiatric: She has a normal mood and affect. Her behavior is normal. Judgment and thought content normal.          Assessment & Plan:

## 2010-08-26 ENCOUNTER — Telehealth: Payer: Self-pay | Admitting: *Deleted

## 2010-08-26 NOTE — Telephone Encounter (Signed)
Patient says that she has been having a terrible time with leg cramps during the night. She has been taking 1 methocarbamol 50 mg every night, but it doesn't really help. She is asking if there is something else that she could try.

## 2010-08-26 NOTE — Telephone Encounter (Signed)
Unfortunately without examining patient, I do not feel comfortable sending in another medication. Will defer to Dr. Alphonsus Sias who will return on Monday. If cramps are severe, needs to be evaluated.

## 2010-08-26 NOTE — Telephone Encounter (Signed)
Uses [facility] [address].

## 2010-08-26 NOTE — Telephone Encounter (Signed)
Spoke with patient and advised results, pt will wait until Dr.Letvak returns.

## 2010-08-29 NOTE — Telephone Encounter (Signed)
Spoke with patient and advised results, pt will call if anything changes.

## 2010-08-29 NOTE — Telephone Encounter (Signed)
Pt called back, wanted to let you know that she is having a lot of twitching in her legs during the night, would like to have a muscle relaxer to try.  Uses [facility] [address].

## 2010-08-29 NOTE — Telephone Encounter (Signed)
Methocarbamol is a muscle relaxer She can try 2 of the 500mg  if that is what she has May also benefit from OTC potassium tab if she wants to try that  Would need appt to review if those things don't help

## 2010-09-08 ENCOUNTER — Encounter: Payer: Self-pay | Admitting: Family Medicine

## 2010-09-08 ENCOUNTER — Ambulatory Visit (INDEPENDENT_AMBULATORY_CARE_PROVIDER_SITE_OTHER): Payer: Medicare Other | Admitting: Family Medicine

## 2010-09-08 VITALS — BP 130/80 | HR 64 | Temp 97.8°F | Ht 63.0 in | Wt 103.0 lb

## 2010-09-08 DIAGNOSIS — R252 Cramp and spasm: Secondary | ICD-10-CM

## 2010-09-08 DIAGNOSIS — J069 Acute upper respiratory infection, unspecified: Secondary | ICD-10-CM

## 2010-09-08 LAB — BASIC METABOLIC PANEL
BUN: 20 mg/dL (ref 6–23)
CO2: 30 mEq/L (ref 19–32)
Calcium: 9.9 mg/dL (ref 8.4–10.5)
Chloride: 107 mEq/L (ref 96–112)
Creatinine, Ser: 0.5 mg/dL (ref 0.4–1.2)
GFR: 122.65 mL/min (ref >60.00)
Glucose, Bld: 91 mg/dL (ref 70–99)
Potassium: 5.2 mEq/L — ABNORMAL HIGH (ref 3.5–5.1)
Sodium: 142 mEq/L (ref 135–145)

## 2010-09-08 LAB — MAGNESIUM: Magnesium: 2.1 mg/dL (ref 1.5–2.5)

## 2010-09-08 MED ORDER — METHOCARBAMOL 500 MG PO TABS: ORAL_TABLET | ORAL | Status: DC

## 2010-09-08 NOTE — Assessment & Plan Note (Signed)
New. No clear ongoing bacterial infection. Low risk as not smoker and no past lung disease. Continue Zyrtec.    Call if not improving in next 4-5 days.

## 2010-09-08 NOTE — Assessment & Plan Note (Signed)
Unchanged. Advised to continuing taking two tablets of Robaxin at night for her symptoms since that seems to help. Will check Mag and BMET today as electrolyte abnormalities may be contributing.

## 2010-09-08 NOTE — Progress Notes (Signed)
  Subjective:    Patient ID: Holly Koch, female    DOB: [birth year], 78 y.o.   MRN: [id number]  HPI  Leg cramps- has been ongoing for two months, mainly at night. Tried heating pad, most recently tried methocarbamol 500mg   1 at bedtime as needed for cramps which helped some.  Last night doubled up to 1000 mg and had the best night sleep she has had in months! No leg cramps.    Congestion- 2 weeks of congestion, mildly productive cough. No fevers or chills.  Cannot tolerate mucinex. Taking Zyrtec which is helping. Having some ear pressure but feels much better today than she did yesterday.      Past Medical History  Diagnosis Date  . GERD (gastroesophageal reflux disease)   . Hyperlipidemia   . Hypertension   . Osteoporosis   . Pre-syncope   . Raynaud's phenomenon   . Migraine   . Lung nodule     chronic, left  . OSA (obstructive sleep apnea)     Past Surgical History  Procedure Date  . Appendectomy   . Hemorrhoid surgery 1961  . Abdominal hysterectomy   . Tonsillectomy and adenoidectomy 1936  . Breast biopsy 1988    benign  . Tumor excision 1958    benign tumor  . Knee arthroscopy 1970    right knee  . Esophageal dilation   . Uvulopalatopharyngoplasty 2001    for sleep apnea  . Patella fracture surgery 2007    repair of fractured right patella  . Colon surgery     Family History  Problem Relation Age of Onset  . Heart disease Mother   . Diabetes Mother   . Alzheimer's disease Sister   . Heart disease Brother     History   Social History  . Marital Status: Married    Spouse Name: N/A    Number of Children: 2  . Years of Education: N/A   Occupational History  . retired Charity fundraiser    Social History Main Topics  . Smoking status: Never Smoker   . Smokeless tobacco: Not on file  . Alcohol Use: No  . Drug Use: Not on file  . Sexually Active: Not on file   Other Topics Concern  . Not on file   Social History Narrative   Has living will, son Roney Jaffe is her healthcare POA. Has DNR order and wants to continue it. Would not accept tube feeds   Review of Systems See HPI     Objective:   Physical Exam  BP 130/80  Pulse 64  Temp(Src) 97.8 F (36.6 C) (Oral)  Ht 5\' 3"  (1.6 m)  Wt 103 lb (46.72 kg)  BMI 18.25 kg/m2 HEENT: TMs clear bilaterally Throat:  Mildly injected, no exudate Lungs:   CTA bilaterally CVS:  RRR Constitutional: She is oriented to person, place, and time. She appears well-developed and well-nourished. No distress.  Neurological: She is alert and oriented to person, place, and time.  Psychiatric: She has a normal mood and affect. Her behavior is normal. Judgment and thought content normal.

## 2010-09-30 ENCOUNTER — Encounter: Payer: Self-pay | Admitting: Family Medicine

## 2010-09-30 ENCOUNTER — Other Ambulatory Visit: Payer: Self-pay | Admitting: *Deleted

## 2010-09-30 ENCOUNTER — Ambulatory Visit (INDEPENDENT_AMBULATORY_CARE_PROVIDER_SITE_OTHER)
Admission: RE | Admit: 2010-09-30 | Discharge: 2010-09-30 | Disposition: A | Discharge status: Home / Self Care | Payer: Medicare Other | Source: Ambulatory Visit | Attending: Family Medicine | Admitting: Family Medicine

## 2010-09-30 ENCOUNTER — Ambulatory Visit (INDEPENDENT_AMBULATORY_CARE_PROVIDER_SITE_OTHER): Payer: Medicare Other | Admitting: Family Medicine

## 2010-09-30 VITALS — BP 130/78 | HR 58 | Temp 97.6°F | Ht 63.0 in | Wt 101.1 lb

## 2010-09-30 DIAGNOSIS — R059 Cough, unspecified: Secondary | ICD-10-CM

## 2010-09-30 DIAGNOSIS — R1013 Epigastric pain: Secondary | ICD-10-CM | POA: Insufficient documentation

## 2010-09-30 DIAGNOSIS — R05 Cough: Secondary | ICD-10-CM

## 2010-09-30 LAB — BASIC METABOLIC PANEL
CO2: 28 mEq/L (ref 19–32)
Calcium: 10.1 mg/dL (ref 8.4–10.5)
Creatinine, Ser: 0.6 mg/dL (ref 0.4–1.2)
Glucose, Bld: 92 mg/dL (ref 70–99)

## 2010-09-30 LAB — CBC WITH DIFFERENTIAL/PLATELET
Basophils Relative: 0.6 % (ref 0.0–3.0)
Eosinophils Absolute: 0.1 10*3/uL (ref 0.0–0.7)
Eosinophils Relative: 2.1 % (ref 0.0–5.0)
HCT: 42.3 % (ref 36.0–46.0)
Lymphs Abs: 0.8 10*3/uL (ref 0.7–4.0)
MCHC: 34.3 g/dL (ref 30.0–36.0)
MCV: 94.9 fl (ref 78.0–100.0)
Monocytes Absolute: 0.5 10*3/uL (ref 0.1–1.0)
Neutrophils Relative %: 76.7 % (ref 43.0–77.0)
Platelets: 214 10*3/uL (ref 150.0–400.0)
RBC: 4.45 Mil/uL (ref 3.87–5.11)
WBC: 6.7 10*3/uL (ref 4.5–10.5)

## 2010-09-30 LAB — HEPATIC FUNCTION PANEL
ALT: 16 U/L (ref 0–35)
Albumin: 4.2 g/dL (ref 3.5–5.2)
Total Protein: 6.2 g/dL (ref 6.0–8.3)

## 2010-09-30 MED ORDER — METHOCARBAMOL 500 MG PO TABS: ORAL_TABLET | ORAL | Status: DC

## 2010-09-30 MED ORDER — VALACYCLOVIR HCL 1 G PO TABS: 2000.0000 mg | ORAL_TABLET | Freq: Three times a day (TID) | ORAL | Status: AC

## 2010-09-30 NOTE — Telephone Encounter (Signed)
Let her know Rx done.

## 2010-09-30 NOTE — Progress Notes (Addendum)
Subjective:    Patient ID: Holly Koch, female    DOB: 1927/04/07, 77 y.o.   MRN: [id number]  HPI  Discussed with Morrie Sheldon, [place] nurse from [facility], advised pt to come in.  Abdominal pain- past week and half, worsening left sided epigastric pain that radiates to her side and back when she coughs. Never had anything like this before. Does not hurt to take deep breaths, only hurts when her bra squeezes against it. Afebrile.   Pain is not stabbing, but it is aggravating. She always wears a bra but cannot wear one for past several days.   Ibuprofen helps. Taking Methocarbamol for leg cramps which is also helping.     Has had one month of congestion, mildly productive cough. No fevers or chills.  Cannot tolerate mucinex. Taking Zyrtec which is helping. Having some ear pressure but feels like that is better but cough is lingering.  No SOB or wheezing. No fevers. No nausea, vomiting or changes in bowel habits.     Has had her shingles vaccination.   ROS: See HPI  Past Medical History  Diagnosis Date  . GERD (gastroesophageal reflux disease)   . Hyperlipidemia   . Hypertension   . Osteoporosis   . Pre-syncope   . Raynaud's phenomenon   . Migraine   . Lung nodule     chronic, left  . OSA (obstructive sleep apnea)     Past Surgical History  Procedure Date  . Appendectomy   . Hemorrhoid surgery 1961  . Abdominal hysterectomy   . Tonsillectomy and adenoidectomy 1936  . Breast biopsy 1988    benign  . Tumor excision 1958    benign tumor  . Knee arthroscopy 1970    right knee  . Esophageal dilation   . Uvulopalatopharyngoplasty 2001    for sleep apnea  . Patella fracture surgery 2007    repair of fractured right patella  . Colon surgery     Family History  Problem Relation Age of Onset  . Heart disease Mother   . Diabetes Mother   . Alzheimer's disease Sister   . Heart disease Brother     History   Social History  . Marital Status: Married    Spouse  Name: N/A    Number of Children: 2  . Years of Education: N/A   Occupational History  . retired Charity fundraiser    Social History Main Topics  . Smoking status: Never Smoker   . Smokeless tobacco: Not on file  . Alcohol Use: No  . Drug Use: Not on file  . Sexually Active: Not on file   Other Topics Concern  . Not on file   Social History Narrative   Has living will, son Roney Jaffe is her healthcare POA. Has DNR order and wants to continue it. Would not accept tube feeds   Review of Systems See HPI     Objective:   Physical Exam  BP 130/78  Pulse 58  Temp(Src) 97.6 F (36.4 C) (Oral)  Ht 5\' 3"  (1.6 m)  Wt 101 lb 1.9 oz (45.868 kg)  BMI 17.91 kg/m2  SpO2 98%  HEENT: TMs clear bilaterally Throat:  Mildly injected, no exudate Lungs:   CTA bilaterally, TTP over left inferior rib cage CVS:  RRR Skin:  No rash apparent Abdomen- soft, NT, + BS, no rebound, no guarding Constitutional: She is oriented to person, place, and time. She appears well-developed and well-nourished. No distress.  Neurological: She is alert and oriented  to person, place, and time.  Psychiatric: She has a normal mood and affect. Her behavior is normal. Judgment and thought content normal.   A/P- abdominal pain-  Etiology unclear, not classic for PNA or rib fracture/strain, however they are both on the differential. Skin is what is tender although she can describe the pain as sharp, intermittent or stabbing, more "annoying." ?shingles without rash, which is possible.  She is outside window for treatment with antiviral. Will check labs- CBC, BMET, hepatic panel, lipase (although not classic for pancreatitis either).  CXR today- no acute findings. No rx given today as not yet sure what we are treating but does require close follow up.

## 2010-09-30 NOTE — Progress Notes (Signed)
Addended by: Dianne Dun on: 09/30/2010 02:02 PM   Modules accepted: Orders

## 2010-10-03 ENCOUNTER — Ambulatory Visit: Payer: Medicare Other | Admitting: Internal Medicine

## 2010-10-17 ENCOUNTER — Telehealth: Payer: Self-pay | Admitting: *Deleted

## 2010-10-17 NOTE — Telephone Encounter (Signed)
Okay to send Rx for norco 5/325 1/2-1 every 4 hours prn #30 x 0 She needs to be careful with first doses to be sure she is not overly sedated

## 2010-10-17 NOTE — Telephone Encounter (Signed)
Pt was seen on 6/1 for shingles.  She finished valtrex about 2 weeks ago.  She was not given any pain meds and she continues to have a lot of pain.  She is going out of town next week for a wedding and she wants to be feeling better by then.  What can she do?  She's been taking ibuprofen, but that doesn't help much.  Uses [facility] [address].

## 2010-10-18 MED ORDER — HYDROCODONE-ACETAMINOPHEN 5-325 MG PO TABS: 0.5000 | ORAL_TABLET | ORAL | Status: DC | PRN

## 2010-10-18 NOTE — Telephone Encounter (Signed)
rx called into pharmacy, Spoke with patient and advised results   

## 2010-11-30 ENCOUNTER — Other Ambulatory Visit: Payer: Self-pay | Admitting: Internal Medicine

## 2010-12-14 ENCOUNTER — Encounter: Payer: Self-pay | Admitting: Internal Medicine

## 2010-12-14 ENCOUNTER — Ambulatory Visit (INDEPENDENT_AMBULATORY_CARE_PROVIDER_SITE_OTHER): Payer: Medicare Other | Admitting: Internal Medicine

## 2010-12-14 DIAGNOSIS — R252 Cramp and spasm: Secondary | ICD-10-CM

## 2010-12-14 DIAGNOSIS — I10 Essential (primary) hypertension: Secondary | ICD-10-CM

## 2010-12-14 DIAGNOSIS — G3184 Mild cognitive impairment, so stated: Secondary | ICD-10-CM

## 2010-12-14 DIAGNOSIS — K219 Gastro-esophageal reflux disease without esophagitis: Secondary | ICD-10-CM

## 2010-12-14 NOTE — Assessment & Plan Note (Signed)
Well controlled with muscle relaxer No apparent side effects

## 2010-12-14 NOTE — Assessment & Plan Note (Signed)
BP Readings from Last 3 Encounters:  12/14/10 142/78  09/30/10 130/78  09/08/10 130/80   Good control without meds

## 2010-12-14 NOTE — Assessment & Plan Note (Signed)
Has not been active of late

## 2010-12-14 NOTE — Progress Notes (Signed)
Subjective:    Patient ID: Holly Koch, female    DOB: [birth year], 77 y.o.   MRN: [id number]  HPI Doing very well now LEg cramps are better--methocarbamol really helps Cramps come back if she skips doses No AM fatigue or somnolence on the med  Had mild case of shingles---has cleared up for the most part  Feels Holly Koch memory is stable No change in cognitive abilities Enjoys baking and has gotten back to that Visits in neighborhood and goes to church Does all bills, shopping,etc. Did turn financial affairs over to son for the major stuff though  No chest pain No SOB No edema  Current Outpatient Prescriptions on File Prior to Visit  Medication Sig Dispense Refill  . aspirin 325 MG tablet Take 325 mg by mouth daily.        . Calcium Citrate-Vitamin D (CITRACAL/VITAMIN D) 250-200 MG-UNIT TABS Take by mouth 2 (two) times daily.        . cetirizine (ZYRTEC) 10 MG tablet Take 10 mg by mouth daily.        Marland Kitchen donepezil (ARICEPT) 10 MG tablet Take 1 tablet (10 mg total) by mouth at bedtime.  30 tablet  11  . HYDROcodone-acetaminophen (NORCO) 5-325 MG per tablet Take 0.5-1 tablets by mouth every 4 (four) hours as needed.  30 tablet  0  . methocarbamol (ROBAXIN) 500 MG tablet TAKE ONE TO TWO TABLETS BY MOUTH AT BEDTIME AS NEEDED FOR LEG CRAMPS  60 tablet  0  . Nutritional Supplements (JUICE PLUS FIBRE) LIQD Take by mouth daily.        . Red Yeast Rice 600 MG CAPS Take by mouth daily.          Allergies  Allergen Reactions  . Rofecoxib     REACTION: makes feet and ankles swell    Past Medical History  Diagnosis Date  . GERD (gastroesophageal reflux disease)   . Hyperlipidemia   . Hypertension   . Osteoporosis   . Pre-syncope   . Raynaud's phenomenon   . Migraine   . Lung nodule     chronic, left  . OSA (obstructive sleep apnea)     Past Surgical History  Procedure Date  . Appendectomy   . Hemorrhoid surgery 1961  . Abdominal hysterectomy   . Tonsillectomy and  adenoidectomy 1936  . Breast biopsy 1988    benign  . Tumor excision 1958    benign tumor  . Knee arthroscopy 1970    right knee  . Esophageal dilation   . Uvulopalatopharyngoplasty 2001    for sleep apnea  . Patella fracture surgery 2007    repair of fractured right patella  . Colon surgery     Family History  Problem Relation Age of Onset  . Heart disease Mother   . Diabetes Mother   . Alzheimer's disease Sister   . Heart disease Brother     History   Social History  . Marital Status: Married    Spouse Name: N/A    Number of Children: 2  . Years of Education: N/A   Occupational History  . retired Charity fundraiser    Social History Main Topics  . Smoking status: Never Smoker   . Smokeless tobacco: Not on file  . Alcohol Use: No  . Drug Use: Not on file  . Sexually Active: Not on file   Other Topics Concern  . Not on file   Social History Narrative   Has living will, son Roney Jaffe is  Holly Koch healthcare POA. Has DNR order and wants to continue it. Would not accept tube feeds   Review of Systems Sleeping well--occ awakens at [facility] and has trouble getting back to sleep Appetite is good Weight is up a couple of pounds Goes to wellness center daily for work-outs. Uses walker when on walking track due to occ dizziness    Objective:   Physical Exam  Constitutional: She appears well-developed and well-nourished. No distress.  Neck: Normal range of motion. Neck supple. No thyromegaly present.  Cardiovascular: Normal rate, regular rhythm, normal heart sounds and intact distal pulses.  Exam reveals no gallop.   No murmur heard. Pulmonary/Chest: Effort normal and breath sounds normal. No respiratory distress. She has no wheezes. She has no rales.  Abdominal: Soft. There is no tenderness.  Musculoskeletal: Normal range of motion. She exhibits no edema and no tenderness.  Lymphadenopathy:    She has no cervical adenopathy.  Neurological: She exhibits normal muscle tone.       Normal  gait and strength  Psychiatric: She has a normal mood and affect. Holly Koch behavior is normal. Judgment and thought content normal.          Assessment & Plan:

## 2010-12-14 NOTE — Assessment & Plan Note (Signed)
No apparent progression on the donepezil Will continue Doing well with independent status still

## 2011-01-26 ENCOUNTER — Other Ambulatory Visit: Payer: Self-pay | Admitting: Family Medicine

## 2011-05-16 ENCOUNTER — Encounter: Payer: Self-pay | Admitting: Internal Medicine

## 2011-05-16 ENCOUNTER — Ambulatory Visit (INDEPENDENT_AMBULATORY_CARE_PROVIDER_SITE_OTHER): Payer: Medicare Other | Admitting: Internal Medicine

## 2011-05-16 DIAGNOSIS — G479 Sleep disorder, unspecified: Secondary | ICD-10-CM | POA: Insufficient documentation

## 2011-05-16 NOTE — Assessment & Plan Note (Signed)
Having bad dreams and sleep disturbance No daytime mood issues Appetite is good Exercises daily  Will try melatonin If ineffective, trial with trazodone Then could consider benzo (but reluctantly)

## 2011-05-16 NOTE — Patient Instructions (Addendum)
Please try melatonin (available over the counter) before you go to sleep. I would recommend 3-5 mg nightly. If this isn't helpful after about 2 weeks, call for a prescription

## 2011-05-16 NOTE — Progress Notes (Signed)
Subjective:    Patient ID: Holly Koch, female    DOB: Sep 17, 1926, 78 y.o.   MRN: [id number]  HPI Has been having trouble sleeping Awakening with nightmares and then can't seem to get back to sleep Then can't shake the anxiety about the dreams Awakens tired at times Wonders about trying klonopin---helped son in the past  Has the dreams just about every night Doesn't know of any stress Mood has been down a bit due to tiredness---but not overtly depressed Tries to stay active in campus events. Regular with church  Is going to wellness center every morning----uses sitting elliptical and walks (with walker---gives stability due to balance issues)  Hasn't tried any meds as yet  Current Outpatient Prescriptions on File Prior to Visit  Medication Sig Dispense Refill  . aspirin 325 MG tablet Take 325 mg by mouth daily.        . Calcium Citrate-Vitamin D (CITRACAL/VITAMIN D) 250-200 MG-UNIT TABS Take by mouth 2 (two) times daily.        . cetirizine (ZYRTEC) 10 MG tablet Take 10 mg by mouth daily.        Marland Kitchen donepezil (ARICEPT) 10 MG tablet Take 1 tablet (10 mg total) by mouth at bedtime.  30 tablet  11  . HYDROcodone-acetaminophen (NORCO) 5-325 MG per tablet Take 0.5-1 tablets by mouth every 4 (four) hours as needed.  30 tablet  0  . methocarbamol (ROBAXIN) 500 MG tablet TAKE ONE TO TWO TABLETS BY MOUTH AT BEDTIME AS NEEDED FOR  LEG  CRAMPS  60 tablet  1  . Nutritional Supplements (JUICE PLUS FIBRE) LIQD Take by mouth daily.        . Red Yeast Rice 600 MG CAPS Take by mouth daily.          Allergies  Allergen Reactions  . Rofecoxib     REACTION: makes feet and ankles swell    Past Medical History  Diagnosis Date  . GERD (gastroesophageal reflux disease)   . Hyperlipidemia   . Hypertension   . Osteoporosis   . Pre-syncope   . Raynaud's phenomenon   . Migraine   . Lung nodule     chronic, left  . OSA (obstructive sleep apnea)     Past Surgical History  Procedure Date  .  Appendectomy   . Hemorrhoid surgery 1961  . Abdominal hysterectomy   . Tonsillectomy and adenoidectomy 1936  . Breast biopsy 1988    benign  . Tumor excision 1958    benign tumor  . Knee arthroscopy 1970    right knee  . Esophageal dilation   . Uvulopalatopharyngoplasty 2001    for sleep apnea  . Patella fracture surgery 2007    repair of fractured right patella  . Colon surgery     Family History  Problem Relation Age of Onset  . Heart disease Mother   . Diabetes Mother   . Alzheimer's disease Sister   . Heart disease Brother     History   Social History  . Marital Status: Married    Spouse Name: N/A    Number of Children: 2  . Years of Education: N/A   Occupational History  . retired Charity fundraiser    Social History Main Topics  . Smoking status: Never Smoker   . Smokeless tobacco: Never Used  . Alcohol Use: No  . Drug Use: Not on file  . Sexually Active: Not on file   Other Topics Concern  . Not on file  Social History Narrative   Has living will, son Roney Jaffe is her healthcare POA. Has DNR order and wants to continue it. Would not accept tube feeds   Review of Systems Appetite is fine Weight stable    Objective:   Physical Exam  Psychiatric: She has a normal mood and affect. Her behavior is normal. Judgment and thought content normal.          Assessment & Plan:

## 2011-05-31 ENCOUNTER — Telehealth: Payer: Self-pay | Admitting: Internal Medicine

## 2011-05-31 NOTE — Telephone Encounter (Signed)
Sounds good

## 2011-05-31 NOTE — Telephone Encounter (Signed)
Mrs Abbe Amsterdam called, says she was told to call today to inform Dr. Alphonsus Sias how she is doing, says she is doing well. Says if things change she will call the office.Marland KitchenMarland KitchenMarland Kitchen

## 2011-06-12 ENCOUNTER — Telehealth: Payer: Self-pay | Admitting: Internal Medicine

## 2011-06-12 NOTE — Telephone Encounter (Signed)
Patient says the over the counter drug (Melatonin) that you prescribed is giving her terrible dreams and when she wakes up her mind is racing and she can't get back to sleep.

## 2011-06-12 NOTE — Telephone Encounter (Signed)
Please have her stop that and put it on allergy list (intolerance)  As I had planned, try trazodone 50mg  at bedtime. If not effective after 3-4 nights, increase to 2 tabs nightly (#60 x 3)

## 2011-06-13 ENCOUNTER — Telehealth: Payer: Self-pay | Admitting: *Deleted

## 2011-06-13 MED ORDER — TRAZODONE HCL 50 MG PO TABS: 50.0000 mg | ORAL_TABLET | Freq: Every day | ORAL | Status: DC

## 2011-06-13 NOTE — Telephone Encounter (Signed)
Spoke with patient and advised results rx sent to pharmacy by e-script  

## 2011-06-13 NOTE — Telephone Encounter (Signed)
Patient called stating that she has been having difficulty sleeping and nightmares. Patient states that she was advised to try Melatonin which is not helping at all. Patient would like something called to the pharmacy to help her get some sleep. Pharmacy/ [facility]

## 2011-06-13 NOTE — Telephone Encounter (Signed)
See prior phone note Trying trazodone

## 2011-06-16 ENCOUNTER — Ambulatory Visit: Payer: Medicare Other | Admitting: Internal Medicine

## 2011-06-19 DIAGNOSIS — I10 Essential (primary) hypertension: Secondary | ICD-10-CM | POA: Diagnosis not present

## 2011-06-19 DIAGNOSIS — I059 Rheumatic mitral valve disease, unspecified: Secondary | ICD-10-CM | POA: Diagnosis not present

## 2011-06-23 ENCOUNTER — Encounter: Payer: Self-pay | Admitting: Internal Medicine

## 2011-06-23 ENCOUNTER — Ambulatory Visit (INDEPENDENT_AMBULATORY_CARE_PROVIDER_SITE_OTHER): Payer: Medicare Other | Admitting: Internal Medicine

## 2011-06-23 ENCOUNTER — Ambulatory Visit: Payer: Medicare Other | Admitting: Internal Medicine

## 2011-06-23 DIAGNOSIS — R252 Cramp and spasm: Secondary | ICD-10-CM | POA: Diagnosis not present

## 2011-06-23 DIAGNOSIS — I10 Essential (primary) hypertension: Secondary | ICD-10-CM

## 2011-06-23 DIAGNOSIS — G478 Other sleep disorders: Secondary | ICD-10-CM | POA: Diagnosis not present

## 2011-06-23 DIAGNOSIS — G3184 Mild cognitive impairment, so stated: Secondary | ICD-10-CM | POA: Diagnosis not present

## 2011-06-23 DIAGNOSIS — G479 Sleep disorder, unspecified: Secondary | ICD-10-CM

## 2011-06-23 NOTE — Assessment & Plan Note (Signed)
Doing well with the trazodone 50mg  daily Discussed she can try skipping if doing well (initiates fine--trouble with awakening)

## 2011-06-23 NOTE — Assessment & Plan Note (Signed)
Improved Rarely using the methocarbamol now

## 2011-06-23 NOTE — Assessment & Plan Note (Signed)
Cognition seems to be stable May need family involvement if any apparent changes---son is in [place] Still on donepezil Will stop the red yeast rice, just in case

## 2011-06-23 NOTE — Assessment & Plan Note (Signed)
BP Readings from Last 3 Encounters:  06/23/11 128/60  05/16/11 128/70  12/14/10 142/78   Fine without meds

## 2011-06-23 NOTE — Progress Notes (Signed)
Subjective:    Patient ID: Holly Koch, female    DOB: 1927-04-22, 78 y.o.   MRN: [id number]  HPI Doing fairly well  Had been having sleep problems and bad dreams Melatonin didn't help Has done well on the trazodone  Leg cramps have been better Hasn't been using the methocarbamol that often  Memory seems about the same No obvious decline Some recall problems --like names Does all ADLs and instrumental ADLs---including paying most bills  Current Outpatient Prescriptions on File Prior to Visit  Medication Sig Dispense Refill  . aspirin 325 MG tablet Take 325 mg by mouth daily.        . Calcium Citrate-Vitamin D (CITRACAL/VITAMIN D) 250-200 MG-UNIT TABS Take by mouth 2 (two) times daily.        Marland Kitchen donepezil (ARICEPT) 10 MG tablet Take 1 tablet (10 mg total) by mouth at bedtime.  30 tablet  11  . methocarbamol (ROBAXIN) 500 MG tablet TAKE ONE TO TWO TABLETS BY MOUTH AT BEDTIME AS NEEDED FOR  LEG  CRAMPS  60 tablet  1  . Nutritional Supplements (JUICE PLUS FIBRE) LIQD Take by mouth daily.        . traZODone (DESYREL) 50 MG tablet Take 1-2 tablets (50-100 mg total) by mouth at bedtime.  60 tablet  3    Allergies  Allergen Reactions  . Melatonin     Causes bad dreams  . Rofecoxib     REACTION: makes feet and ankles swell    Past Medical History  Diagnosis Date  . GERD (gastroesophageal reflux disease)   . Hyperlipidemia   . Hypertension   . Osteoporosis   . Pre-syncope   . Raynaud's phenomenon   . Migraine   . Lung nodule     chronic, left  . OSA (obstructive sleep apnea)     Past Surgical History  Procedure Date  . Appendectomy   . Hemorrhoid surgery 1961  . Abdominal hysterectomy   . Tonsillectomy and adenoidectomy 1936  . Breast biopsy 1988    benign  . Tumor excision 1958    benign tumor  . Knee arthroscopy 1970    right knee  . Esophageal dilation   . Uvulopalatopharyngoplasty 2001    for sleep apnea  . Patella fracture surgery 2007    repair of  fractured right patella  . Colon surgery     Family History  Problem Relation Age of Onset  . Heart disease Mother   . Diabetes Mother   . Alzheimer's disease Sister   . Heart disease Brother     History   Social History  . Marital Status: Married    Spouse Name: N/A    Number of Children: 2  . Years of Education: N/A   Occupational History  . retired Charity fundraiser    Social History Main Topics  . Smoking status: Never Smoker   . Smokeless tobacco: Never Used  . Alcohol Use: No  . Drug Use: Not on file  . Sexually Active: Not on file   Other Topics Concern  . Not on file   Social History Narrative   Has living will, son Roney Jaffe is her healthcare POA. Has DNR order and wants to continue it. Would not accept tube feeds   Review of Systems Appetite is fine Weight stable Bowels are fine Mood has been good Goes daily to wellness center Does things daily     Objective:   Physical Exam  Constitutional: She appears well-developed and well-nourished. No  distress.  Neck: Normal range of motion. Neck supple. No thyromegaly present.  Cardiovascular: Normal rate, regular rhythm and normal heart sounds.  Exam reveals no gallop.   No murmur heard. Pulmonary/Chest: Effort normal and breath sounds normal. No respiratory distress. She has no wheezes. She has no rales.  Musculoskeletal: She exhibits no edema and no tenderness.  Lymphadenopathy:    She has no cervical adenopathy.  Psychiatric: She has a normal mood and affect. Her behavior is normal.          Assessment & Plan:

## 2011-07-06 ENCOUNTER — Telehealth: Payer: Self-pay | Admitting: *Deleted

## 2011-07-06 NOTE — Telephone Encounter (Signed)
Change to the 7.5mg  1-2 nightly prn #60 x 0

## 2011-07-06 NOTE — Telephone Encounter (Signed)
The temazepam comes in capsule so it can't be 1/2-1, should I change to 1 whole capsule?

## 2011-07-06 NOTE — Telephone Encounter (Signed)
Patient called to let Dr. Alphonsus Sias know that the Trazodone is causing her to have nightmares.  She is requesting another medication or asking for your advise on what to do.  Please advise.

## 2011-07-06 NOTE — Telephone Encounter (Signed)
Put the trazodone on her allergy list as intolerance--mild (nightmares)   Okay to try temazepam 15mg    1/2-1 at bedtime prn She should limit this to no more than 3 nights per week at most to prevent dependence #30x 0

## 2011-07-07 MED ORDER — TEMAZEPAM 7.5 MG PO CAPS: 7.5000 mg | ORAL_CAPSULE | Freq: Every evening | ORAL | Status: DC | PRN

## 2011-07-07 NOTE — Telephone Encounter (Signed)
rx called into pharmacy Spoke with patient and advised results   

## 2011-07-10 NOTE — Telephone Encounter (Signed)
Patient called to check to see if her Rx was called to [facility] for Temazepam.  I called [facility] and spoke with the pharmacist and was advised that patient requested that the Rx be transferred to another pharmacy because they didn't have the medication in stock.  Rx was sent to [facility], I called and the Rx is ready for pick up, patient advised.

## 2011-07-14 ENCOUNTER — Telehealth: Payer: Self-pay | Admitting: *Deleted

## 2011-07-14 NOTE — Telephone Encounter (Signed)
Patients son [name]) called concerned about his mom.  She started taking Temazepam earlier in the week and was doing well on it with taking one pill but when she started taking two pills she starting have horrible nightmares again and she stated that she dreamed that someone was trying to kill her.  [name] stated that patient admitted to having suicidal thoughts when taking the Temazepam but she refused to take it anymore and he stated that he feels like patient is of no harm to herself or other simply because he knows that she will not take anymore Temazepam.  He knows that Dr. Alphonsus Sias is out of the office unitl Monday and is ok with waiting until then for a response.

## 2011-07-14 NOTE — Telephone Encounter (Signed)
We will need to discuss options in the office I am not excited about trying any sleep meds again given her severe reactions

## 2011-07-15 IMAGING — CR DG CHEST 2V
2 series · 2 of 2 positions shown · non-contrast
Comparison: None.

CLINICAL DATA: Cough.

CHEST - 2 VIEW

[view not recorded (1 of 2)]
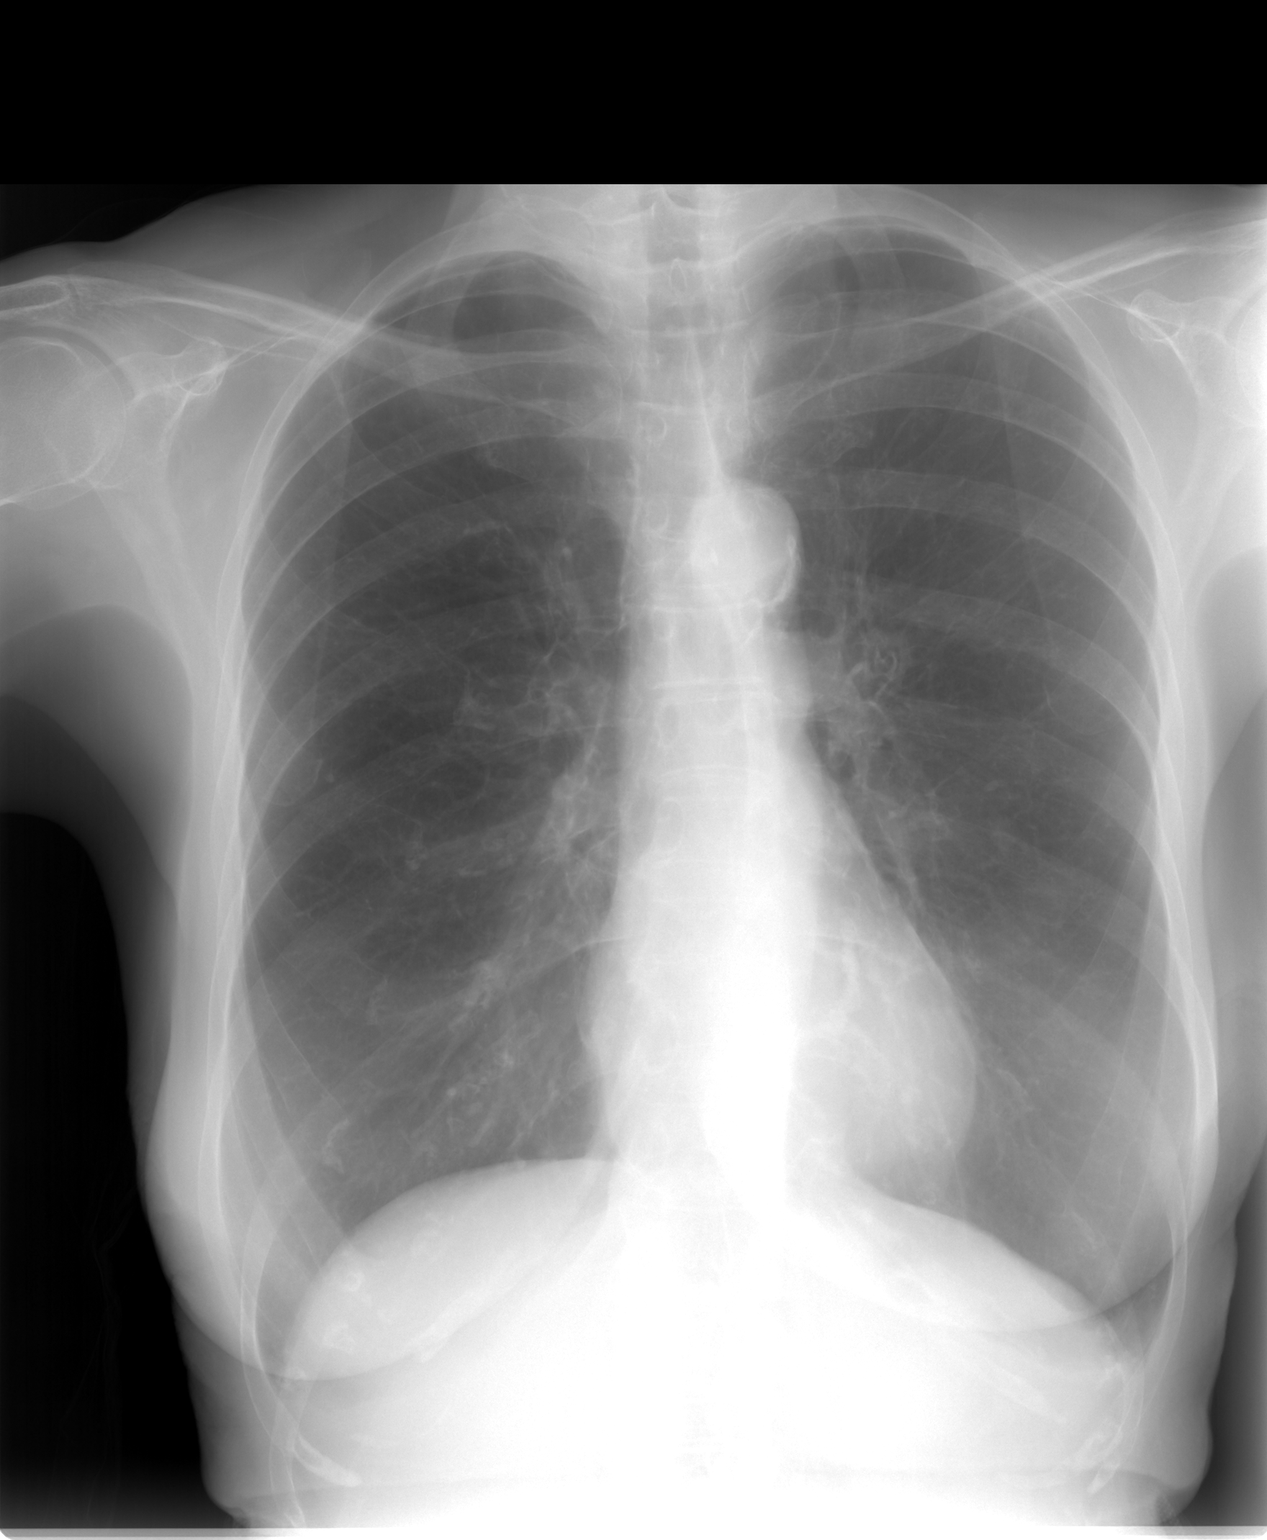

[view not recorded (2 of 2)]
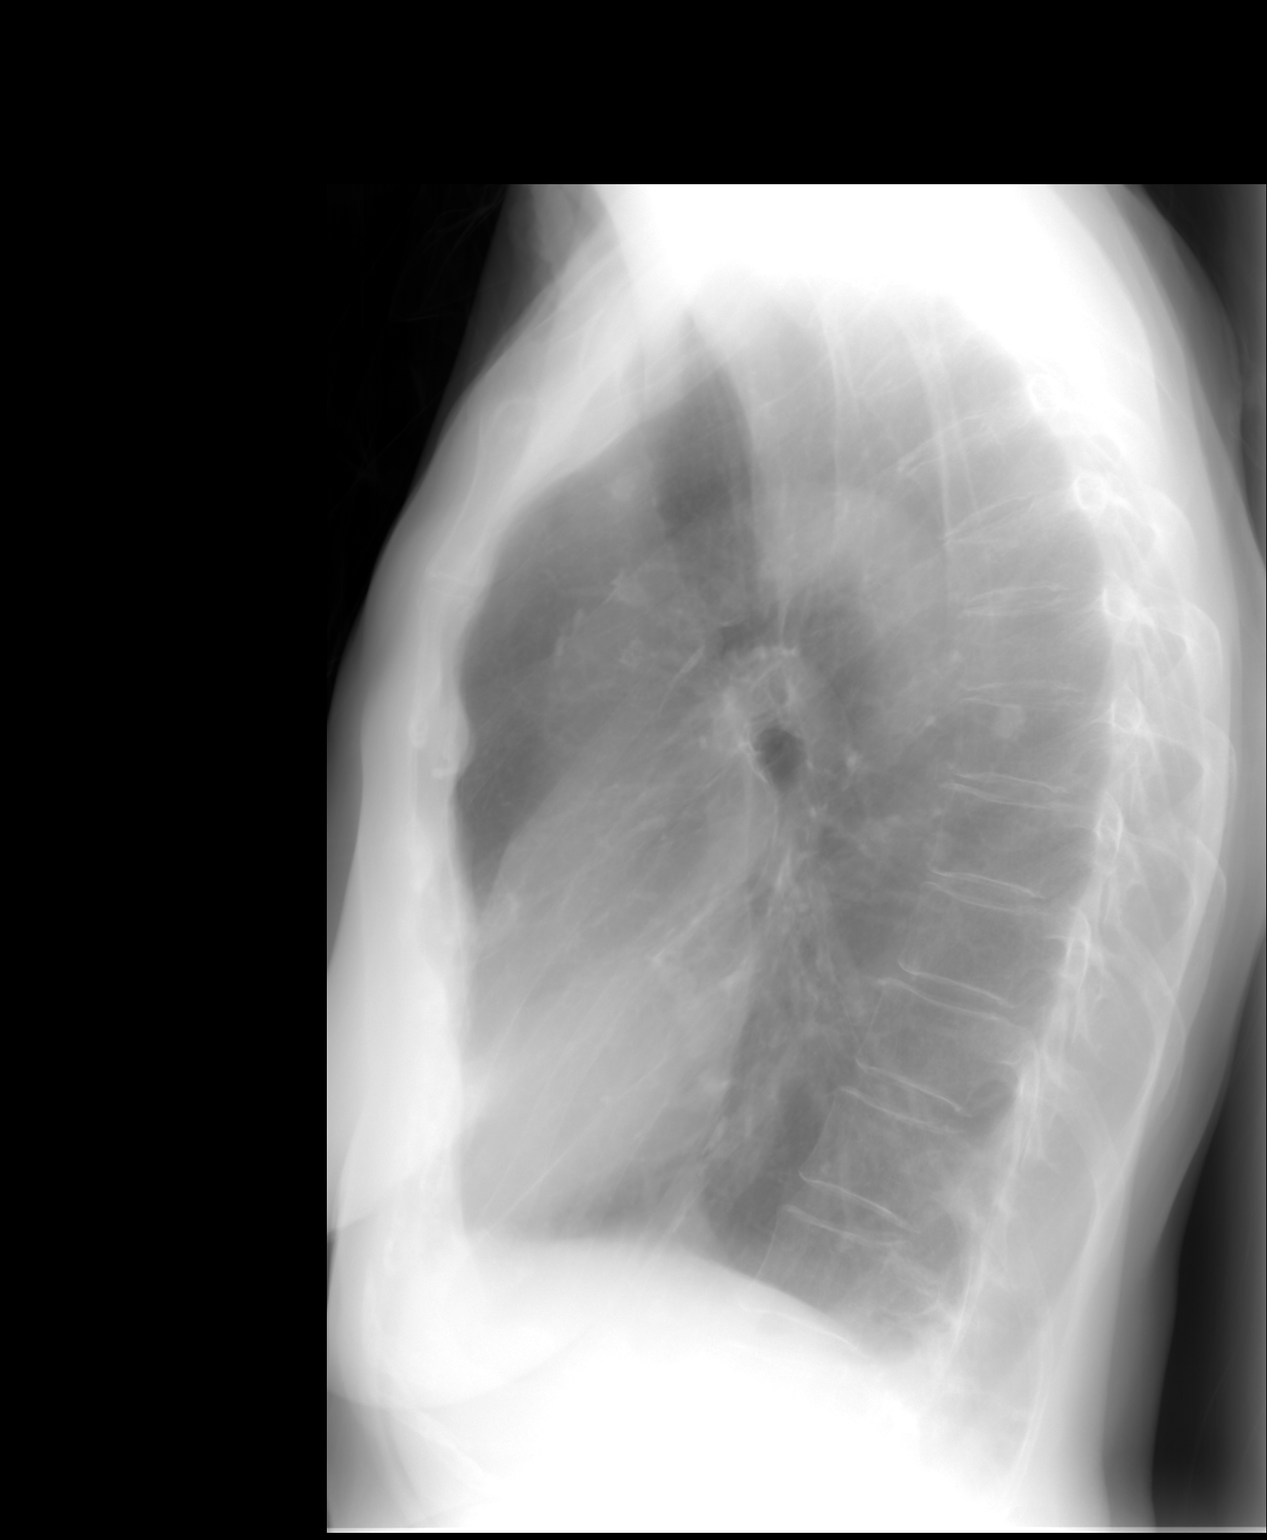

[2 of 2 positions shown; findings below may reference images not displayed]

FINDINGS: Trachea is midline.  Heart size normal. Mitral annulus
calcification.  Thoracic aorta is calcified.  There may be a
calcified granuloma projecting over the mid thoracic spine on the
lateral view that is not well seen on the frontal view.  Lungs are
hyperinflated but otherwise clear.  No pleural fluid.
IMPRESSION: COPD without acute finding.

## 2011-07-18 NOTE — Telephone Encounter (Signed)
Left message to have son return my call.  Also received form from [employer]/ [employer] asking for prior auth for temazepam, I will wait to request prior auth form to see if pt will still be on temazepam.

## 2011-07-19 NOTE — Telephone Encounter (Signed)
Spoke with son and he wanted to know if she can re-start the trazodone? I advised that per her allergy list trazodone caused nightmares also, I advised son I would have to ask, son also wanted to know if the aricept could be causing these side effects? Son also thinks the nightmares started before any sleep medications where started, he also thinks that when she increased the medication that was a problem.  Please advise

## 2011-07-20 NOTE — Telephone Encounter (Signed)
Possible aricept could be causing the problems but not likely It is also possible that the nightmares could be related to the dementia---this occasionally happens Should see if son can accompany her the next time she is in the office

## 2011-07-23 DIAGNOSIS — E785 Hyperlipidemia, unspecified: Secondary | ICD-10-CM | POA: Diagnosis not present

## 2011-07-23 DIAGNOSIS — I059 Rheumatic mitral valve disease, unspecified: Secondary | ICD-10-CM | POA: Diagnosis not present

## 2011-07-23 DIAGNOSIS — G4730 Sleep apnea, unspecified: Secondary | ICD-10-CM | POA: Diagnosis not present

## 2011-07-23 DIAGNOSIS — R0789 Other chest pain: Secondary | ICD-10-CM | POA: Diagnosis not present

## 2011-07-23 DIAGNOSIS — I7300 Raynaud's syndrome without gangrene: Secondary | ICD-10-CM | POA: Diagnosis not present

## 2011-07-23 DIAGNOSIS — Z8673 Personal history of transient ischemic attack (TIA), and cerebral infarction without residual deficits: Secondary | ICD-10-CM | POA: Diagnosis not present

## 2011-07-23 DIAGNOSIS — M810 Age-related osteoporosis without current pathological fracture: Secondary | ICD-10-CM | POA: Diagnosis not present

## 2011-07-23 DIAGNOSIS — R262 Difficulty in walking, not elsewhere classified: Secondary | ICD-10-CM | POA: Diagnosis not present

## 2011-07-23 DIAGNOSIS — Z79899 Other long term (current) drug therapy: Secondary | ICD-10-CM | POA: Diagnosis not present

## 2011-07-23 DIAGNOSIS — I10 Essential (primary) hypertension: Secondary | ICD-10-CM | POA: Diagnosis not present

## 2011-07-23 DIAGNOSIS — I200 Unstable angina: Secondary | ICD-10-CM | POA: Diagnosis not present

## 2011-07-23 DIAGNOSIS — R6889 Other general symptoms and signs: Secondary | ICD-10-CM | POA: Diagnosis not present

## 2011-07-23 DIAGNOSIS — Z7982 Long term (current) use of aspirin: Secondary | ICD-10-CM | POA: Diagnosis not present

## 2011-07-24 ENCOUNTER — Observation Stay: Payer: Self-pay | Admitting: Internal Medicine

## 2011-07-24 DIAGNOSIS — R0789 Other chest pain: Secondary | ICD-10-CM | POA: Diagnosis not present

## 2011-07-24 DIAGNOSIS — I059 Rheumatic mitral valve disease, unspecified: Secondary | ICD-10-CM | POA: Diagnosis not present

## 2011-07-24 DIAGNOSIS — I10 Essential (primary) hypertension: Secondary | ICD-10-CM | POA: Diagnosis not present

## 2011-07-24 DIAGNOSIS — E785 Hyperlipidemia, unspecified: Secondary | ICD-10-CM | POA: Diagnosis not present

## 2011-07-24 DIAGNOSIS — I200 Unstable angina: Secondary | ICD-10-CM | POA: Diagnosis not present

## 2011-07-24 DIAGNOSIS — R079 Chest pain, unspecified: Secondary | ICD-10-CM | POA: Diagnosis not present

## 2011-07-24 LAB — CBC
HCT: 37.7 % (ref 35.0–47.0)
HGB: 12.7 g/dL (ref 12.0–16.0)
MCH: 30.5 pg (ref 26.0–34.0)
MCHC: 33.6 g/dL (ref 32.0–36.0)
MCV: 91 fL (ref 80–100)
RBC: 4.16 10*6/uL (ref 3.80–5.20)
RDW: 13.5 % (ref 11.5–14.5)

## 2011-07-24 LAB — COMPREHENSIVE METABOLIC PANEL
Alkaline Phosphatase: 88 U/L (ref 50–136)
Anion Gap: 11 (ref 7–16)
BUN: 27 mg/dL — ABNORMAL HIGH (ref 7–18)
Calcium, Total: 9.1 mg/dL (ref 8.5–10.1)
Creatinine: 0.59 mg/dL — ABNORMAL LOW (ref 0.60–1.30)
EGFR (African American): >60
EGFR (Non-African Amer.): >60
Osmolality: 294 (ref 275–301)
Potassium: 3.7 mmol/L (ref 3.5–5.1)
SGPT (ALT): 29 U/L
Sodium: 145 mmol/L (ref 136–145)
Total Protein: 7.1 g/dL (ref 6.4–8.2)

## 2011-07-24 LAB — TROPONIN I
Troponin-I: <0.02 ng/mL
Troponin-I: <0.02 ng/mL

## 2011-07-24 LAB — CK-MB
CK-MB: 2.1 ng/mL (ref 0.5–3.6)
CK-MB: 1.8 ng/mL (ref 0.5–3.6)

## 2011-07-24 LAB — CK TOTAL AND CKMB (NOT AT ARMC): CK, Total: 46 U/L (ref 21–215)

## 2011-07-25 DIAGNOSIS — R079 Chest pain, unspecified: Secondary | ICD-10-CM | POA: Diagnosis not present

## 2011-07-25 DIAGNOSIS — I200 Unstable angina: Secondary | ICD-10-CM | POA: Diagnosis not present

## 2011-07-25 DIAGNOSIS — I059 Rheumatic mitral valve disease, unspecified: Secondary | ICD-10-CM | POA: Diagnosis not present

## 2011-07-25 DIAGNOSIS — I10 Essential (primary) hypertension: Secondary | ICD-10-CM | POA: Diagnosis not present

## 2011-07-25 DIAGNOSIS — E785 Hyperlipidemia, unspecified: Secondary | ICD-10-CM | POA: Diagnosis not present

## 2011-07-25 LAB — LIPID PANEL
Cholesterol: 185 mg/dL (ref 0–200)
Ldl Cholesterol, Calc: 80 mg/dL (ref 0–100)
Triglycerides: 80 mg/dL (ref 0–200)
VLDL Cholesterol, Calc: 16 mg/dL (ref 5–40)

## 2011-07-31 NOTE — Telephone Encounter (Signed)
.  left message to have patient's son return my call.  

## 2011-08-01 ENCOUNTER — Encounter: Payer: Self-pay | Admitting: Internal Medicine

## 2011-08-01 ENCOUNTER — Ambulatory Visit (INDEPENDENT_AMBULATORY_CARE_PROVIDER_SITE_OTHER): Payer: Medicare Other | Admitting: Internal Medicine

## 2011-08-01 VITALS — BP 122/68 | HR 67 | Temp 98.2°F | Ht 63.0 in | Wt 101.0 lb

## 2011-08-01 DIAGNOSIS — G3184 Mild cognitive impairment, so stated: Secondary | ICD-10-CM | POA: Diagnosis not present

## 2011-08-01 DIAGNOSIS — G478 Other sleep disorders: Secondary | ICD-10-CM

## 2011-08-01 DIAGNOSIS — I10 Essential (primary) hypertension: Secondary | ICD-10-CM | POA: Diagnosis not present

## 2011-08-01 DIAGNOSIS — G479 Sleep disorder, unspecified: Secondary | ICD-10-CM

## 2011-08-01 DIAGNOSIS — R252 Cramp and spasm: Secondary | ICD-10-CM

## 2011-08-01 NOTE — Progress Notes (Signed)
  Subjective:    Patient ID: Holly Koch, female    DOB: 05-Dec-1926, 78 y.o.   MRN: [id number]  HPI Daughter-Melissa [facility]) and son-Andy([place]) here Hospitalized last week with chest pain Had negative myoview and enzymes and was sent home No ongoing chest pain since  Feels better  Feels a little shaky and wobbly Was reluctant to go home at first--feels some better now Still drives locally to grocery, etc. Hasn't gotten lost Manages on her own still  Mild memory issues Son and daughter feel it is worsening No problems with the med Is willing to move to assisted living if that is recommended  Still having trouble sleeping Initiates well but then troubled with bad dreams. Then preoccupied with this and trouble getting back to sleep Has been using the trazodone occ---- doesn't think it causes the bad dreams Melatonin no help Temazepam really helped for 2 nights---then felt bad in the morning (suicidal thoughts even)  Review of Systems Fearful about falling--so she is very careful Uses the methocarbamol for leg cramps---fairly regular     Objective:   Physical Exam  Constitutional: She appears well-developed and well-nourished. No distress.  Neck: Normal range of motion. Neck supple. No thyromegaly present.  Cardiovascular: Normal rate, regular rhythm and normal heart sounds.  Exam reveals no gallop.   No murmur heard. Pulmonary/Chest: Effort normal and breath sounds normal. No respiratory distress. She has no wheezes. She has no rales.  Abdominal: Soft. There is no tenderness.  Musculoskeletal: She exhibits no edema and no tenderness.  Lymphadenopathy:    She has no cervical adenopathy.  Psychiatric: She has a normal mood and affect. Her behavior is normal.          Assessment & Plan:

## 2011-08-01 NOTE — Assessment & Plan Note (Signed)
Still with regular symptoms The methocarbamol seems to help without apparent side effects

## 2011-08-01 NOTE — Assessment & Plan Note (Signed)
Ongoing slow progression Still seems to be okay in her [place] Will have the nurses monitor her and consider transfer to assisted living if that is needed

## 2011-08-01 NOTE — Assessment & Plan Note (Signed)
BP Readings from Last 3 Encounters:  08/01/11 122/68  06/23/11 128/60  05/16/11 128/70   Good control without meds

## 2011-08-01 NOTE — Assessment & Plan Note (Signed)
Will just have her use the trazodone occasionally No more temazepam May want to try an after lunch nap

## 2011-08-01 NOTE — Telephone Encounter (Signed)
Patient coming in today for OV, Dr. Alphonsus Sias will discuss then.

## 2011-08-21 ENCOUNTER — Other Ambulatory Visit: Payer: Self-pay | Admitting: Internal Medicine

## 2011-08-21 ENCOUNTER — Telehealth: Payer: Self-pay

## 2011-08-21 NOTE — Telephone Encounter (Signed)
Pt wants to pick up form for pt moving to [facility] assisted living. Geraldine Contras said form is ready and at front desk for pick up. Patient notified as instructed by telephone.

## 2011-08-28 ENCOUNTER — Other Ambulatory Visit: Payer: Self-pay | Admitting: *Deleted

## 2011-08-28 MED ORDER — METHOCARBAMOL 500 MG PO TABS: 500.0000 mg | ORAL_TABLET | Freq: Every evening | ORAL | Status: DC | PRN

## 2011-08-28 MED ORDER — DONEPEZIL HCL 10 MG PO TABS: 10.0000 mg | ORAL_TABLET | Freq: Every day | ORAL | Status: DC

## 2011-08-28 NOTE — Telephone Encounter (Signed)
rx faxed to pharmacy thru [facility]  

## 2011-09-01 ENCOUNTER — Ambulatory Visit: Payer: Medicare Other | Admitting: Internal Medicine

## 2011-09-14 ENCOUNTER — Non-Acute Institutional Stay: Payer: Medicare Other | Admitting: Internal Medicine

## 2011-09-14 VITALS — BP 120/78 | HR 60 | Temp 98.0°F | Resp 16 | Wt 102.0 lb

## 2011-09-14 DIAGNOSIS — G479 Sleep disorder, unspecified: Secondary | ICD-10-CM

## 2011-09-14 DIAGNOSIS — G3184 Mild cognitive impairment, so stated: Secondary | ICD-10-CM | POA: Diagnosis not present

## 2011-09-14 DIAGNOSIS — K219 Gastro-esophageal reflux disease without esophagitis: Secondary | ICD-10-CM

## 2011-09-14 DIAGNOSIS — R252 Cramp and spasm: Secondary | ICD-10-CM

## 2011-09-14 DIAGNOSIS — I10 Essential (primary) hypertension: Secondary | ICD-10-CM

## 2011-09-14 NOTE — Assessment & Plan Note (Signed)
Sleeping better here other than occ leg cramps Hasn't needed the trazodone

## 2011-09-14 NOTE — Assessment & Plan Note (Signed)
Mild memory changes without progression Has adjusted to moving here extremely well On the donepezil

## 2011-09-14 NOTE — Assessment & Plan Note (Signed)
BP Readings from Last 3 Encounters:  09/14/11 120/78  08/01/11 122/68  06/23/11 128/60   Good control without meds

## 2011-09-14 NOTE — Assessment & Plan Note (Signed)
Does well with occ methocarbamol No apparent problems with this occasionally

## 2011-09-14 NOTE — Progress Notes (Signed)
Subjective:    Patient ID: Holly Koch, female    DOB: 28-Jul-1926, 78 y.o.   MRN: [id number]  HPI Has moved into assisted living "It is fantastic---they are not breathing down my neck but are there if I need them" Enjoys many of the activities Participating in wellness classes with [employer]im[employer] Needle  Still having trouble with leg cramps--the muscle relaxer does help Walks several times a week---helps keep her knees loose  Food is good--she likes this Weight is stable  Ongoing memory issues but no obvious progression Sleeps okay --hasn't needed the trazodone Gets the methocarbamol perhaps 2-3 times per week No morning somnolence or confusion when she gets this  Current Outpatient Prescriptions on File Prior to Visit  Medication Sig Dispense Refill  . aspirin 325 MG tablet Take 325 mg by mouth daily.        . Calcium Citrate-Vitamin D (CITRACAL/VITAMIN D) 250-200 MG-UNIT TABS Take by mouth 2 (two) times daily.        Marland Kitchen donepezil (ARICEPT) 10 MG tablet Take 1 tablet (10 mg total) by mouth at bedtime.  30 tablet  11  . methocarbamol (ROBAXIN) 500 MG tablet Take 1-2 tablets (500-1,000 mg total) by mouth at bedtime as needed.  60 tablet  0  . Nutritional Supplements (NUTRITIONAL SUPPLEMENT PLUS PO) Take 1 capsule by mouth daily.      . traZODone (DESYREL) 50 MG tablet Take 50-100 mg by mouth at bedtime as needed.        Allergies  Allergen Reactions  . Melatonin     Causes bad dreams  . Rofecoxib     REACTION: makes feet and ankles swell  . Trazodone And Nefazodone Other (See Comments)    nightmares    Past Medical History  Diagnosis Date  . GERD (gastroesophageal reflux disease)   . Hyperlipidemia   . Hypertension   . Osteoporosis   . Pre-syncope   . Raynaud's phenomenon   . Migraine   . Lung nodule     chronic, left  . OSA (obstructive sleep apnea)   . Chest pain 3/13    Myoview stress test negative    Past Surgical History  Procedure Date  . Appendectomy   .  Hemorrhoid surgery 1961  . Abdominal hysterectomy   . Tonsillectomy and adenoidectomy 1936  . Breast biopsy 1988    benign  . Tumor excision 1958    benign tumor  . Knee arthroscopy 1970    right knee  . Esophageal dilation   . Uvulopalatopharyngoplasty 2001    for sleep apnea  . Patella fracture surgery 2007    repair of fractured right patella  . Colon surgery     Family History  Problem Relation Age of Onset  . Heart disease Mother   . Diabetes Mother   . Alzheimer's disease Sister   . Heart disease Brother     History   Social History  . Marital Status: Married    Spouse Name: N/A    Number of Children: 2  . Years of Education: N/A   Occupational History  . retired Charity fundraiser    Social History Main Topics  . Smoking status: Never Smoker   . Smokeless tobacco: Never Used  . Alcohol Use: No  . Drug Use: Not on file  . Sexually Active: Not on file   Other Topics Concern  . Not on file   Social History Narrative   Has living will, son Roney Jaffe is her healthcare POA. Has DNR  order and wants to continue it. Would not accept tube feeds   Review of Systems Tries to eat prunes daily Bowels regular with this No urinary problems    Objective:   Physical Exam  Constitutional: She appears well-developed and well-nourished. No distress.  Neck: Normal range of motion. Neck supple. No thyromegaly present.  Cardiovascular: Normal rate, regular rhythm, normal heart sounds and intact distal pulses.  Exam reveals no gallop.   No murmur heard. Pulmonary/Chest: Effort normal and breath sounds normal. No respiratory distress. She has no wheezes. She has no rales.  Abdominal: Soft. There is no tenderness.  Musculoskeletal: She exhibits no edema and no tenderness.  Lymphadenopathy:    She has no cervical adenopathy.  Psychiatric: She has a normal mood and affect. Her behavior is normal.          Assessment & Plan:

## 2011-09-14 NOTE — Assessment & Plan Note (Signed)
stomach has been quiet without meds

## 2011-12-21 ENCOUNTER — Encounter: Payer: Self-pay | Admitting: Internal Medicine

## 2011-12-21 ENCOUNTER — Non-Acute Institutional Stay: Payer: Medicare Other | Admitting: Internal Medicine

## 2011-12-21 VITALS — BP 100/60 | HR 58 | Temp 97.8°F | Resp 16 | Wt 107.0 lb

## 2011-12-21 DIAGNOSIS — G479 Sleep disorder, unspecified: Secondary | ICD-10-CM | POA: Diagnosis not present

## 2011-12-21 DIAGNOSIS — R252 Cramp and spasm: Secondary | ICD-10-CM

## 2011-12-21 DIAGNOSIS — G3184 Mild cognitive impairment, so stated: Secondary | ICD-10-CM

## 2011-12-21 DIAGNOSIS — I10 Essential (primary) hypertension: Secondary | ICD-10-CM

## 2011-12-21 DIAGNOSIS — K219 Gastro-esophageal reflux disease without esophagitis: Secondary | ICD-10-CM

## 2011-12-21 NOTE — Assessment & Plan Note (Signed)
Better lately May be due to regular walking and exercise Hasn't needed the muscle relaxer lately

## 2011-12-21 NOTE — Assessment & Plan Note (Signed)
No recent stomach trouble

## 2011-12-21 NOTE — Assessment & Plan Note (Signed)
Does well with trazodone 50 mg

## 2011-12-21 NOTE — Progress Notes (Signed)
Subjective:    Patient ID: Holly Koch, female    DOB: 09-23-1926, 78 y.o.   MRN: [id number]  HPI Reveiwed status with Albin Felling, RN "I am thankful every day I am here in assisted living" Doesn't need much help but appreciates the security  Remains active Goes out with friends regularly Involved with fitness program still--also walks daily Uses walker just "for security"  Memory still is not great No apparent progression per staff or resident No functional decline  Still gets occ leg cramps Uses the muscle relaxer rarely now Sleeps well the trazodone---generally only needs one  No chest pain No SOB Exercise tolerance is good  Current Outpatient Prescriptions on File Prior to Visit  Medication Sig Dispense Refill  . aspirin 325 MG tablet Take 325 mg by mouth daily.        . Calcium Citrate-Vitamin D (CITRACAL/VITAMIN D) 250-200 MG-UNIT TABS Take by mouth 2 (two) times daily.        Marland Kitchen docusate sodium (COLACE) 100 MG capsule Take 100 mg by mouth 2 (two) times daily as needed.      . donepezil (ARICEPT) 10 MG tablet Take 1 tablet (10 mg total) by mouth at bedtime.  30 tablet  11  . ibuprofen (ADVIL,MOTRIN) 200 MG tablet Take 200 mg by mouth 3 (three) times daily as needed.      . methocarbamol (ROBAXIN) 500 MG tablet Take 1-2 tablets (500-1,000 mg total) by mouth at bedtime as needed.  60 tablet  0  . Nutritional Supplements (NUTRITIONAL SUPPLEMENT PLUS PO) Take 1 capsule by mouth daily.      . traZODone (DESYREL) 50 MG tablet Take 50-100 mg by mouth at bedtime as needed.        Allergies  Allergen Reactions  . Melatonin     Causes bad dreams  . Rofecoxib     REACTION: makes feet and ankles swell  . Trazodone And Nefazodone Other (See Comments)    nightmares    Past Medical History  Diagnosis Date  . GERD (gastroesophageal reflux disease)   . Hyperlipidemia   . Hypertension   . Osteoporosis   . Pre-syncope   . Raynaud's phenomenon   . Migraine   . Lung nodule    chronic, left  . OSA (obstructive sleep apnea)   . Chest pain 3/13    Myoview stress test negative    Past Surgical History  Procedure Date  . Appendectomy   . Hemorrhoid surgery 1961  . Abdominal hysterectomy   . Tonsillectomy and adenoidectomy 1936  . Breast biopsy 1988    benign  . Tumor excision 1958    benign tumor  . Knee arthroscopy 1970    right knee  . Esophageal dilation   . Uvulopalatopharyngoplasty 2001    for sleep apnea  . Patella fracture surgery 2007    repair of fractured right patella  . Colon surgery     Family History  Problem Relation Age of Onset  . Heart disease Mother   . Diabetes Mother   . Alzheimer's disease Sister   . Heart disease Brother     History   Social History  . Marital Status: Married    Spouse Name: N/A    Number of Children: 2  . Years of Education: N/A   Occupational History  . retired Charity fundraiser    Social History Main Topics  . Smoking status: Never Smoker   . Smokeless tobacco: Never Used  . Alcohol Use: No  . Drug  Use: Not on file  . Sexually Active: Not on file   Other Topics Concern  . Not on file   Social History Narrative   Has living will, son Roney Jaffe is her healthcare POA. Has DNR order and wants to continue it. Would not accept tube feeds   Review of Systems Appetite is good Weight is up a few pounds No major arthritis issues     Objective:   Physical Exam  Constitutional: She appears well-developed and well-nourished. No distress.  Neck: Normal range of motion. Neck supple. No thyromegaly present.  Cardiovascular: Normal rate, regular rhythm and normal heart sounds.  Exam reveals no gallop.   No murmur heard. Pulmonary/Chest: Effort normal and breath sounds normal. No respiratory distress. She has no wheezes. She has no rales.  Abdominal: Soft. There is no tenderness.  Musculoskeletal: She exhibits no edema and no tenderness.  Lymphadenopathy:    She has no cervical adenopathy.  Neurological: She  exhibits normal muscle tone. Coordination normal.  Skin: No rash noted. No erythema.  Psychiatric: She has a normal mood and affect. Her behavior is normal. Thought content normal.          Assessment & Plan:

## 2011-12-21 NOTE — Assessment & Plan Note (Signed)
Cognitive status is stable and she remains highly functional Will continue the donepezil after discussion

## 2011-12-21 NOTE — Assessment & Plan Note (Signed)
BP Readings from Last 3 Encounters:  12/21/11 100/60  09/14/11 120/78  08/01/11 122/68   Good control without meds

## 2011-12-22 ENCOUNTER — Ambulatory Visit: Payer: Medicare Other | Admitting: Internal Medicine

## 2012-02-09 ENCOUNTER — Other Ambulatory Visit: Payer: Self-pay | Admitting: Internal Medicine

## 2012-02-09 NOTE — Telephone Encounter (Signed)
Okay to refill for 1 year. 

## 2012-02-22 ENCOUNTER — Encounter: Payer: Self-pay | Admitting: Internal Medicine

## 2012-02-22 ENCOUNTER — Non-Acute Institutional Stay: Payer: Medicare Other | Admitting: Internal Medicine

## 2012-02-22 VITALS — BP 120/70 | HR 60 | Temp 97.7°F | Resp 18 | Wt 109.0 lb

## 2012-02-22 DIAGNOSIS — G3184 Mild cognitive impairment, so stated: Secondary | ICD-10-CM

## 2012-02-22 DIAGNOSIS — I10 Essential (primary) hypertension: Secondary | ICD-10-CM | POA: Diagnosis not present

## 2012-02-22 DIAGNOSIS — R252 Cramp and spasm: Secondary | ICD-10-CM

## 2012-02-22 DIAGNOSIS — G479 Sleep disorder, unspecified: Secondary | ICD-10-CM | POA: Diagnosis not present

## 2012-02-22 NOTE — Assessment & Plan Note (Signed)
Mostly just repeats herself and some recall problems No functional deficits but has meds administered No Rx appropriate

## 2012-02-22 NOTE — Progress Notes (Signed)
Subjective:    Patient ID: Holly Koch, female    DOB: Jan 24, 1927, 78 y.o.   MRN: [id number]  HPI Reviewed status with Albin Felling RN Stable status Ongoing memory issues---mostly just repeats herself  She remains very happy with it here---"I thank the Lord every day" Still goes out [place] Stays active Fairly independent but she gets med administration  She feels her memory is stable Some recall issues but no worse  Sleeps well  Continues on the trazodone Awakens early--not new for her  Gets leg cramps at times still Hasn't needed the methocarbamol in the past couple of weeks--but has intermittent need for it  Current Outpatient Prescriptions on File Prior to Visit  Medication Sig Dispense Refill  . aspirin 325 MG tablet Take 325 mg by mouth daily.        . Calcium Citrate-Vitamin D (CITRACAL/VITAMIN D) 250-200 MG-UNIT TABS Take by mouth 2 (two) times daily.        Marland Kitchen donepezil (ARICEPT) 10 MG tablet Take 1 tablet (10 mg total) by mouth at bedtime.  30 tablet  11  . ibuprofen (ADVIL,MOTRIN) 200 MG tablet Take 200 mg by mouth 3 (three) times daily as needed.      . methocarbamol (ROBAXIN) 500 MG tablet Take 1-2 tablets (500-1,000 mg total) by mouth at bedtime as needed.  60 tablet  0  . Nutritional Supplements (NUTRITIONAL SUPPLEMENT PLUS PO) Take 1 capsule by mouth daily.      . traZODone (DESYREL) 50 MG tablet TAKE 1 OR 2 TABS PO AT BEDTIME FOR SLEEP.  60 tablet  11    Allergies  Allergen Reactions  . Melatonin     Causes bad dreams  . Rofecoxib     REACTION: makes feet and ankles swell  . Trazodone And Nefazodone Other (See Comments)    nightmares    Past Medical History  Diagnosis Date  . GERD (gastroesophageal reflux disease)   . Hyperlipidemia   . Hypertension   . Osteoporosis   . Pre-syncope   . Raynaud's phenomenon   . Migraine   . Lung nodule     chronic, left  . OSA (obstructive sleep apnea)   . Chest pain 3/13    Myoview stress  test negative    Past Surgical History  Procedure Date  . Appendectomy   . Hemorrhoid surgery 1961  . Abdominal hysterectomy   . Tonsillectomy and adenoidectomy 1936  . Breast biopsy 1988    benign  . Tumor excision 1958    benign tumor  . Knee arthroscopy 1970    right knee  . Esophageal dilation   . Uvulopalatopharyngoplasty 2001    for sleep apnea  . Patella fracture surgery 2007    repair of fractured right patella  . Colon surgery     Family History  Problem Relation Age of Onset  . Heart disease Mother   . Diabetes Mother   . Alzheimer's disease Sister   . Heart disease Brother     History   Social History  . Marital Status: Widowed    Spouse Name: N/A    Number of Children: 2  . Years of Education: N/A   Occupational History  . retired Charity fundraiser    Social History Main Topics  . Smoking status: Never Smoker   . Smokeless tobacco: Never Used  . Alcohol Use: No  . Drug Use: Not on file  . Sexually Active: Not on file   Other Topics Concern  .  Not on file   Social History Narrative   Has living will, son Roney Jaffe is her healthcare POA. Has DNR order and wants to continue it. Would not accept tube feeds   Review of Systems Appetite is good. Weight up 2# Happy with food Occ uses some ibuprofen for headache---rarely Bowels are okay [facility]walks a mile and uses chair machine (arms and legs) No stomach troubles    Objective:   Physical Exam  Constitutional: She appears well-developed and well-nourished. No distress.  Neck: Normal range of motion. Neck supple. No thyromegaly present.  Cardiovascular: Normal rate, regular rhythm and normal heart sounds.  Exam reveals no gallop.   No murmur heard. Pulmonary/Chest: Effort normal and breath sounds normal. No respiratory distress. She has no wheezes. She has no rales.  Abdominal: Soft. There is no tenderness.  Musculoskeletal: She exhibits no edema and no tenderness.    Lymphadenopathy:    She has no cervical adenopathy.  Skin: No rash noted.  Psychiatric: She has a normal mood and affect. Her behavior is normal.          Assessment & Plan:

## 2012-02-22 NOTE — Assessment & Plan Note (Signed)
Does well with the trazodone 

## 2012-02-22 NOTE — Assessment & Plan Note (Signed)
Only needs meds occasionally No apparent problems when she takes it

## 2012-02-22 NOTE — Assessment & Plan Note (Signed)
BP Readings from Last 3 Encounters:  02/22/12 120/70  12/21/11 100/60  09/14/11 120/78   Good control No changes needed

## 2012-02-26 DIAGNOSIS — Z23 Encounter for immunization: Secondary | ICD-10-CM | POA: Diagnosis not present

## 2012-05-07 IMAGING — CR DG CHEST 2V
1 series · 2 of 2 positions shown · non-contrast
Comparison: none

REASON FOR EXAM: CP
COMMENTS:

[Series 1: w chest pa · 0.14mm/px · 2 of 2 slices shown]
[im 1/2]
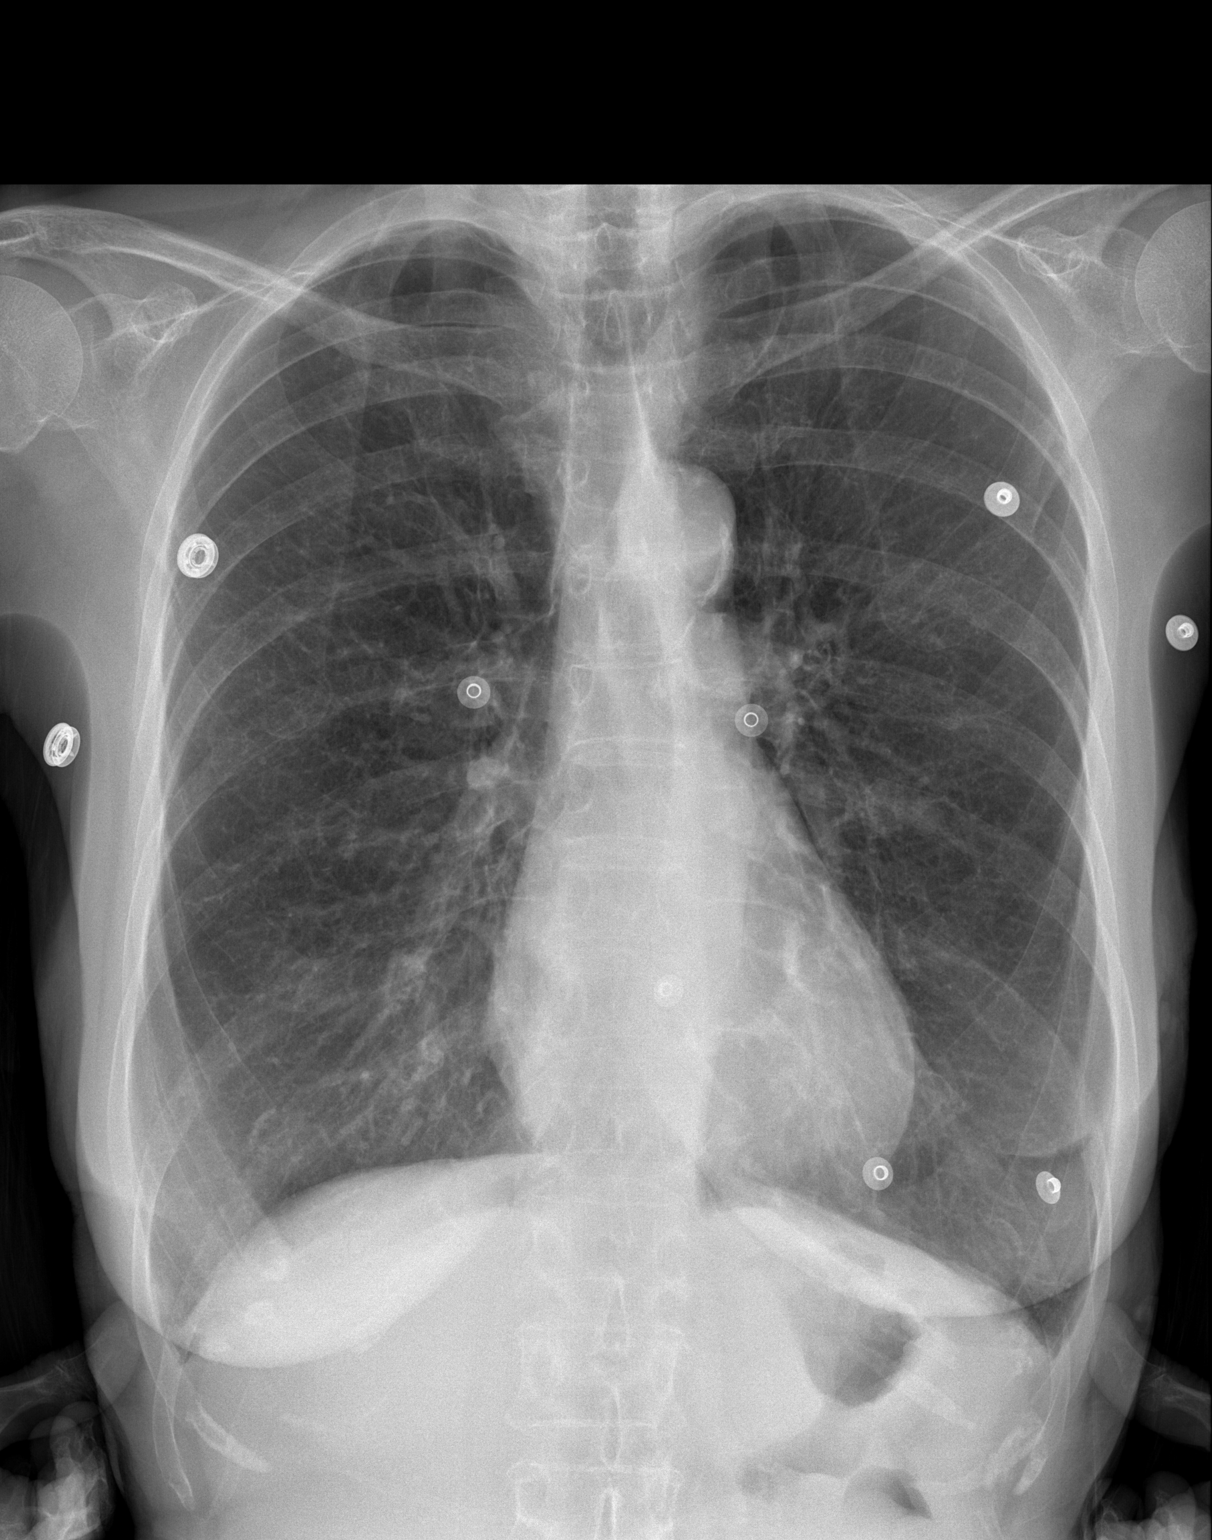
[im 2/2]
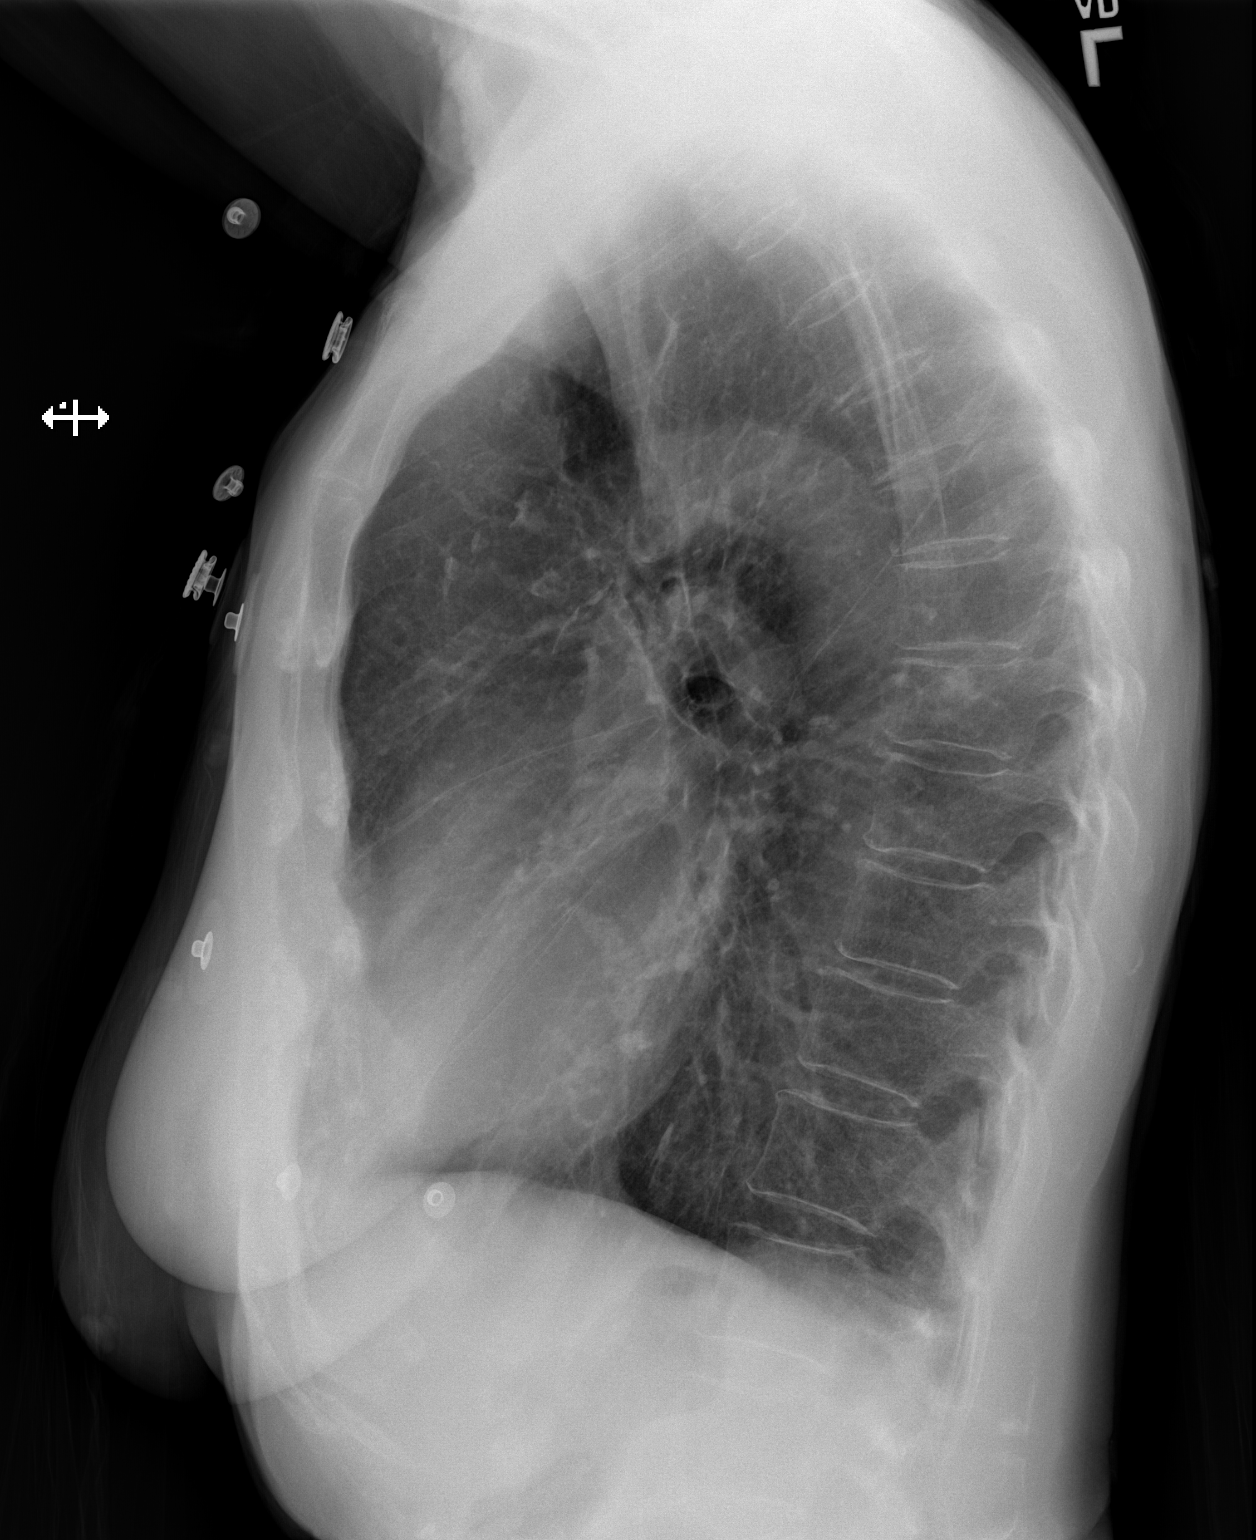

[2 of 2 positions shown; findings below may reference images not displayed]

PROCEDURE:     DXR - DXR CHEST PA (OR AP) AND LATERAL  - July 24, 2011 [DATE]

RESULT:     Comparison is made to a portable chest x-ray December 25, 2009.

The lungs are hyperinflated. There is no focal infiltrate. The cardiac
silhouette is normal in size. The pulmonary vascularity is not engorged.
There is no pneumothorax nor pneumomediastinum. The thoracic vertebral
bodies are reasonably well maintained in height.
IMPRESSION: The findings are consistent with COPD. I do not see
evidence of CHF nor of pneumonia.

## 2012-05-30 ENCOUNTER — Encounter: Payer: Self-pay | Admitting: Internal Medicine

## 2012-05-30 ENCOUNTER — Non-Acute Institutional Stay: Payer: Medicare Other | Admitting: Internal Medicine

## 2012-05-30 VITALS — BP 112/68 | HR 70 | Temp 97.7°F | Resp 18 | Wt 109.0 lb

## 2012-05-30 DIAGNOSIS — I10 Essential (primary) hypertension: Secondary | ICD-10-CM

## 2012-05-30 DIAGNOSIS — R252 Cramp and spasm: Secondary | ICD-10-CM

## 2012-05-30 DIAGNOSIS — G479 Sleep disorder, unspecified: Secondary | ICD-10-CM | POA: Diagnosis not present

## 2012-05-30 DIAGNOSIS — G3184 Mild cognitive impairment, so stated: Secondary | ICD-10-CM | POA: Diagnosis not present

## 2012-05-30 NOTE — Assessment & Plan Note (Signed)
BP Readings from Last 3 Encounters:  05/30/12 112/68  02/22/12 120/70  12/21/11 100/60   Good control Hasn't needed meds

## 2012-05-30 NOTE — Progress Notes (Signed)
Subjective:    Patient ID: Holly Koch, female    DOB: 10-Jul-1926, 79 y.o.   MRN: [id number]  HPI Reviewed status with Albin Felling RN No new concerns No obvious change in cognition per staff and resident Continues to require med administration but independent in all other ADLs  Still goes out [place] friends [place] ([place]) Son visits regularly also--they go out for lunch and necessary shopping Daughter calls daily--stays at [place] with grandchild often (resident's great grandchild)  No chest pain No palpitations Occ slight edema in ankles---not often (and not lately) No dizziness or syncope Walks well with rolling walker Does participate in some of the exercises with [employer]im[employer] Needle  Sleeps well with the med Still occ awakens at [facility] with calf cramps Needs the med 2 times per week or so  Current Outpatient Prescriptions on File Prior to Visit  Medication Sig Dispense Refill  . aspirin 325 MG tablet Take 325 mg by mouth daily.        . Calcium Citrate-Vitamin D (CITRACAL/VITAMIN D) 250-200 MG-UNIT TABS Take by mouth 2 (two) times daily.        Marland Kitchen donepezil (ARICEPT) 10 MG tablet Take 1 tablet (10 mg total) by mouth at bedtime.  30 tablet  11  . methocarbamol (ROBAXIN) 500 MG tablet Take 1-2 tablets (500-1,000 mg total) by mouth at bedtime as needed.  60 tablet  0  . Nutritional Supplements (NUTRITIONAL SUPPLEMENT PLUS PO) Take 1 capsule by mouth daily.      . traZODone (DESYREL) 50 MG tablet TAKE 1 OR 2 TABS PO AT BEDTIME FOR SLEEP.  60 tablet  11    Allergies  Allergen Reactions  . Melatonin     Causes bad dreams  . Rofecoxib     REACTION: makes feet and ankles swell  . Trazodone And Nefazodone Other (See Comments)    nightmares    Past Medical History  Diagnosis Date  . GERD (gastroesophageal reflux disease)   . Hyperlipidemia   . Hypertension   . Osteoporosis   . Pre-syncope   . Raynaud's phenomenon   . Migraine   . Lung nodule     chronic, left  . OSA  (obstructive sleep apnea)   . Chest pain 3/13    Myoview stress test negative    Past Surgical History  Procedure Date  . Appendectomy   . Hemorrhoid surgery 1961  . Abdominal hysterectomy   . Tonsillectomy and adenoidectomy 1936  . Breast biopsy 1988    benign  . Tumor excision 1958    benign tumor  . Knee arthroscopy 1970    right knee  . Esophageal dilation   . Uvulopalatopharyngoplasty 2001    for sleep apnea  . Patella fracture surgery 2007    repair of fractured right patella  . Colon surgery     Family History  Problem Relation Age of Onset  . Heart disease Mother   . Diabetes Mother   . Alzheimer's disease Sister   . Heart disease Brother     History   Social History  . Marital Status: Widowed    Spouse Name: N/A    Number of Children: 2  . Years of Education: N/A   Occupational History  . retired Charity fundraiser    Social History Main Topics  . Smoking status: Never Smoker   . Smokeless tobacco: Never Used  . Alcohol Use: No  . Drug Use: Not on file  . Sexually Active: Not on file   Other  Topics Concern  . Not on file   Social History Narrative   Has living will, son Roney Jaffe is her healthcare POA. Has DNR order and wants to continue it. Would not accept tube feeds   Review of Systems Appetite is good--very happy with the food Weight up slightly Bowels have been fine    Objective:   Physical Exam  Constitutional: She appears well-developed and well-nourished. No distress.  Neck: Normal range of motion. Neck supple. No thyromegaly present.  Cardiovascular: Normal rate, regular rhythm and normal heart sounds.  Exam reveals no gallop.   No murmur heard. Pulmonary/Chest: Effort normal and breath sounds normal. No respiratory distress. She has no wheezes. She has no rales.  Abdominal: Soft. There is no tenderness.  Musculoskeletal: She exhibits no edema and no tenderness.  Lymphadenopathy:    She has no cervical adenopathy.  Psychiatric: She has a  normal mood and affect. Her behavior is normal.          Assessment & Plan:

## 2012-05-30 NOTE — Assessment & Plan Note (Signed)
still with intermittent symptoms Uses the methocarbamol only once in a while and no apparent adverse effects

## 2012-05-30 NOTE — Assessment & Plan Note (Signed)
Does fine with the trazodone No change needed

## 2012-07-25 ENCOUNTER — Encounter: Payer: Self-pay | Admitting: Internal Medicine

## 2012-07-25 DIAGNOSIS — I10 Essential (primary) hypertension: Secondary | ICD-10-CM | POA: Diagnosis not present

## 2012-07-25 DIAGNOSIS — E7800 Pure hypercholesterolemia, unspecified: Secondary | ICD-10-CM | POA: Diagnosis not present

## 2012-08-22 ENCOUNTER — Other Ambulatory Visit: Payer: Self-pay | Admitting: Internal Medicine

## 2012-09-12 ENCOUNTER — Encounter: Payer: Self-pay | Admitting: Internal Medicine

## 2012-09-12 ENCOUNTER — Non-Acute Institutional Stay: Payer: Medicare Other | Admitting: Internal Medicine

## 2012-09-12 VITALS — BP 114/62 | HR 60 | Temp 98.5°F | Resp 16 | Wt 113.0 lb

## 2012-09-12 DIAGNOSIS — K219 Gastro-esophageal reflux disease without esophagitis: Secondary | ICD-10-CM

## 2012-09-12 DIAGNOSIS — G479 Sleep disorder, unspecified: Secondary | ICD-10-CM

## 2012-09-12 DIAGNOSIS — G3184 Mild cognitive impairment, so stated: Secondary | ICD-10-CM | POA: Diagnosis not present

## 2012-09-12 DIAGNOSIS — I10 Essential (primary) hypertension: Secondary | ICD-10-CM | POA: Diagnosis not present

## 2012-09-12 NOTE — Assessment & Plan Note (Signed)
Does well with the trazodone Hasn't needed the muscle relaxer in some time

## 2012-09-12 NOTE — Assessment & Plan Note (Signed)
Just mild memory deficits that don't seem to be increasing Still functionally independent other than meds Continue the aricept just in case it is helping

## 2012-09-12 NOTE — Assessment & Plan Note (Signed)
Has been quiet No meds 

## 2012-09-12 NOTE — Progress Notes (Signed)
Subjective:    Patient ID: Holly Koch, female    DOB: February 16, 1927, 79 y.o.   MRN: [id number]  HPI Reviewed status with Albin Felling RN Continues to do well  Daughter visiting now Son comes regularly---takes her out for shopping and lunch Still goes out with her friends also--but not as often now (harder for her to get around)  Some leg and knee pain Uses tylenol prn for that  Still has the memory problems Doesn't note any significant change Staff haven't seen a change and no loss of function  No chest pain  No SOB Does note some ankle edema by the end of the day----very slight  Sleeps okay with the trazodone Nocturia x 1 but can get back to sleep  Current Outpatient Prescriptions on File Prior to Visit  Medication Sig Dispense Refill  . aspirin 325 MG tablet Take 325 mg by mouth daily.        . Calcium Citrate-Vitamin D (CITRACAL/VITAMIN D) 250-200 MG-UNIT TABS Take by mouth 2 (two) times daily.        Marland Kitchen donepezil (ARICEPT) 10 MG tablet TAKE ONE TABLET BY MOUTH AT BEDTIME. (DEMENTIA)  30 tablet  11  . methocarbamol (ROBAXIN) 500 MG tablet Take 1-2 tablets (500-1,000 mg total) by mouth at bedtime as needed.  60 tablet  0  . Nutritional Supplements (NUTRITIONAL SUPPLEMENT PLUS PO) Take 1 capsule by mouth daily.       No current facility-administered medications on file prior to visit.    Allergies  Allergen Reactions  . Melatonin     Causes bad dreams  . Rofecoxib     REACTION: makes feet and ankles swell  . Trazodone And Nefazodone Other (See Comments)    nightmares    Past Medical History  Diagnosis Date  . GERD (gastroesophageal reflux disease)   . Hyperlipidemia   . Hypertension   . Osteoporosis   . Pre-syncope   . Raynaud's phenomenon   . Migraine   . Lung nodule     chronic, left  . OSA (obstructive sleep apnea)   . Chest pain 3/13    Myoview stress test negative    Past Surgical History  Procedure Laterality Date  . Appendectomy    . Hemorrhoid  surgery  1961  . Abdominal hysterectomy    . Tonsillectomy and adenoidectomy  1936  . Breast biopsy  1988    benign  . Tumor excision  1958    benign tumor  . Knee arthroscopy  1970    right knee  . Esophageal dilation    . Uvulopalatopharyngoplasty  2001    for sleep apnea  . Patella fracture surgery  2007    repair of fractured right patella  . Colon surgery      Family History  Problem Relation Age of Onset  . Heart disease Mother   . Diabetes Mother   . Alzheimer's disease Sister   . Heart disease Brother     History   Social History  . Marital Status: Widowed    Spouse Name: N/A    Number of Children: 2  . Years of Education: N/A   Occupational History  . retired Charity fundraiser    Social History Main Topics  . Smoking status: Never Smoker   . Smokeless tobacco: Never Used  . Alcohol Use: No  . Drug Use: Not on file  . Sexually Active: Not on file   Other Topics Concern  . Not on file   Social History Narrative  Has living will, son Roney Jaffe is her healthcare POA. Has DNR order and wants to continue it. Would not accept tube feeds   Review of Systems Hasn't noticed the leg cramps much--- hasn't needed the methocarbamol Appetite is okay Weight is up a few pounds "I feel well"    Objective:   Physical Exam  Constitutional: She appears well-developed and well-nourished. No distress.  Neck: Normal range of motion. Neck supple. No thyromegaly present.  Cardiovascular: Normal rate, regular rhythm and normal heart sounds.  Exam reveals no gallop.   No murmur heard. Pulmonary/Chest: Effort normal and breath sounds normal. No respiratory distress. She has no wheezes. She has no rales.  Abdominal: Soft. There is no tenderness.  Musculoskeletal: She exhibits no edema and no tenderness.  Lymphadenopathy:    She has no cervical adenopathy.  Psychiatric: She has a normal mood and affect. Her behavior is normal.          Assessment & Plan:

## 2012-09-12 NOTE — Assessment & Plan Note (Signed)
BP Readings from Last 3 Encounters:  09/12/12 114/62  05/30/12 112/68  02/22/12 120/70   Good control without meds

## 2012-10-25 ENCOUNTER — Other Ambulatory Visit: Payer: Self-pay | Admitting: *Deleted

## 2012-10-25 ENCOUNTER — Other Ambulatory Visit: Payer: Self-pay | Admitting: Internal Medicine

## 2012-10-25 MED ORDER — CALCIUM CITRATE-VITAMIN D 250-200 MG-UNIT PO TABS: 1.0000 | ORAL_TABLET | Freq: Two times a day (BID) | ORAL | Status: DC

## 2012-12-26 ENCOUNTER — Encounter: Payer: Self-pay | Admitting: Internal Medicine

## 2012-12-26 ENCOUNTER — Non-Acute Institutional Stay: Payer: Medicare Other | Admitting: Internal Medicine

## 2012-12-26 VITALS — BP 120/68 | HR 72 | Temp 98.0°F | Resp 20 | Wt 115.0 lb

## 2012-12-26 DIAGNOSIS — I10 Essential (primary) hypertension: Secondary | ICD-10-CM

## 2012-12-26 DIAGNOSIS — M1710 Unilateral primary osteoarthritis, unspecified knee: Secondary | ICD-10-CM

## 2012-12-26 DIAGNOSIS — M1711 Unilateral primary osteoarthritis, right knee: Secondary | ICD-10-CM | POA: Insufficient documentation

## 2012-12-26 DIAGNOSIS — R252 Cramp and spasm: Secondary | ICD-10-CM

## 2012-12-26 DIAGNOSIS — G3184 Mild cognitive impairment, so stated: Secondary | ICD-10-CM

## 2012-12-26 DIAGNOSIS — G479 Sleep disorder, unspecified: Secondary | ICD-10-CM | POA: Diagnosis not present

## 2012-12-26 NOTE — Assessment & Plan Note (Signed)
Mostly just stiff Not really painful  No meds for now

## 2012-12-26 NOTE — Assessment & Plan Note (Signed)
Rarely uses the methocarbamol but it helps

## 2012-12-26 NOTE — Assessment & Plan Note (Signed)
Has been stable Will continue the donepezil

## 2012-12-26 NOTE — Assessment & Plan Note (Signed)
Sleeps well with the trazodone

## 2012-12-26 NOTE — Progress Notes (Signed)
Subjective:    Patient ID: Holly Koch, female    DOB: [birth year], 79 y.o.   MRN: [id number]  HPI Reviewed status with [name] RN  She is doing well No concerns other than some leg cramps-- methocarbamol helps (fairly rare) This is usually in the night  Mild cognitive issues persist No progression Still independent with personal care Remains continent  No chest pain No SOB No dizziness  Hasn't been going out with friends as much More difficult with stiff knee Son does take her out for lunch  Current Outpatient Prescriptions on File Prior to Visit  Medication Sig Dispense Refill  . aspirin 325 MG tablet Take 325 mg by mouth daily.        . Calcium Citrate-Vitamin D (CITRACAL/VITAMIN D) 250-200 MG-UNIT TABS Take 1 tablet by mouth 2 (two) times daily.  60 each  11  . donepezil (ARICEPT) 10 MG tablet TAKE ONE TABLET BY MOUTH AT BEDTIME. (DEMENTIA)  30 tablet  11  . methocarbamol (ROBAXIN) 500 MG tablet TAKE 1 OR 2 TABLETS BY MOUTH AT BEDTIME AS NEEDED FOR MUSCLE SPASMS.  60 tablet  11  . Nutritional Supplements (NUTRITIONAL SUPPLEMENT PLUS PO) Take 1 capsule by mouth daily.      . traZODone (DESYREL) 50 MG tablet        No current facility-administered medications on file prior to visit.    Allergies  Allergen Reactions  . Melatonin     Causes bad dreams  . Rofecoxib     REACTION: makes feet and ankles swell  . Trazodone And Nefazodone Other (See Comments)    nightmares    Past Medical History  Diagnosis Date  . GERD (gastroesophageal reflux disease)   . Hyperlipidemia   . Hypertension   . Osteoporosis   . Pre-syncope   . Raynaud's phenomenon   . Migraine   . Lung nodule     chronic, left  . OSA (obstructive sleep apnea)   . Chest pain 3/13    Myoview stress test negative    Past Surgical History  Procedure Laterality Date  . Appendectomy    . Hemorrhoid surgery  1961  . Abdominal hysterectomy    . Tonsillectomy and adenoidectomy  1936  . Breast biopsy   1988    benign  . Tumor excision  1958    benign tumor  . Knee arthroscopy  1970    right knee  . Esophageal dilation    . Uvulopalatopharyngoplasty  2001    for sleep apnea  . Patella fracture surgery  2007    repair of fractured right patella  . Colon surgery      Family History  Problem Relation Age of Onset  . Heart disease Mother   . Diabetes Mother   . Alzheimer's disease Sister   . Heart disease Brother     History   Social History  . Marital Status: Widowed    Spouse Name: N/A    Number of Children: 2  . Years of Education: N/A   Occupational History  . retired Charity fundraiser    Social History Main Topics  . Smoking status: Never Smoker   . Smokeless tobacco: Never Used  . Alcohol Use: No  . Drug Use: Not on file  . Sexual Activity: Not on file   Other Topics Concern  . Not on file   Social History Narrative   Has living will, son Roney Jaffe is her healthcare POA. Has DNR order and wants to continue it.  Would not accept tube feeds    Review of Systems Sleeps well Appetite is fine Weight stable Mood generally fine    Objective:   Physical Exam  Constitutional: She appears well-developed and well-nourished. No distress.  Neck: Normal range of motion. Neck supple. No thyromegaly present.  Cardiovascular: Normal rate, regular rhythm and normal heart sounds.  Exam reveals no gallop.   No murmur heard. Pulmonary/Chest: Effort normal and breath sounds normal. No respiratory distress. She has no wheezes. She has no rales.  Abdominal: Soft. There is no tenderness.  Musculoskeletal: She exhibits no edema and no tenderness.  Lymphadenopathy:    She has no cervical adenopathy.  Psychiatric: She has a normal mood and affect. Her behavior is normal.          Assessment & Plan:

## 2012-12-26 NOTE — Assessment & Plan Note (Signed)
BP Readings from Last 3 Encounters:  12/26/12 120/68  09/12/12 114/62  05/30/12 112/68   Good control No changes needed

## 2013-02-11 ENCOUNTER — Other Ambulatory Visit: Payer: Self-pay | Admitting: Family Medicine

## 2013-02-11 MED ORDER — METHOCARBAMOL 500 MG PO TABS: ORAL_TABLET | ORAL | Status: DC

## 2013-02-27 DIAGNOSIS — Z23 Encounter for immunization: Secondary | ICD-10-CM | POA: Diagnosis not present

## 2013-03-20 ENCOUNTER — Non-Acute Institutional Stay: Payer: Medicare Other | Admitting: Internal Medicine

## 2013-03-20 ENCOUNTER — Encounter: Payer: Self-pay | Admitting: Internal Medicine

## 2013-03-20 VITALS — BP 100/40 | HR 60 | Temp 98.7°F | Resp 14 | Wt 116.0 lb

## 2013-03-20 DIAGNOSIS — I10 Essential (primary) hypertension: Secondary | ICD-10-CM | POA: Diagnosis not present

## 2013-03-20 DIAGNOSIS — G479 Sleep disorder, unspecified: Secondary | ICD-10-CM

## 2013-03-20 DIAGNOSIS — G3184 Mild cognitive impairment, so stated: Secondary | ICD-10-CM | POA: Diagnosis not present

## 2013-03-20 DIAGNOSIS — R252 Cramp and spasm: Secondary | ICD-10-CM

## 2013-03-20 NOTE — Assessment & Plan Note (Signed)
Stable No apraxia Will continue the donepezil

## 2013-03-20 NOTE — Assessment & Plan Note (Signed)
Doing well on the trazodone

## 2013-03-20 NOTE — Assessment & Plan Note (Signed)
BP Readings from Last 3 Encounters:  03/20/13 100/40  12/26/12 120/68  09/12/12 114/62   Good control  Hasn't needed meds

## 2013-03-20 NOTE — Progress Notes (Signed)
Subjective:    Patient ID: Holly Koch, female    DOB: October 09, 1926, 79 y.o.   MRN: [id number]  HPI Reviewed status with [facility] RN No new concerns  She feels she is doing well She continues to be very satisfied here  Still gets night leg cramps Usually 4-5AM, 2-3 days per week The methocarbamol does help No sedation or memory worsening after taking it  Still with mild memory problems No real apraxia She gets along fine with help of staff and writing herself notes  Doesn't go out with friends much Does have monthly bible study from church here Enjoys many of the activities  No chest pain  No SOB No dizziness or syncope  Current Outpatient Prescriptions on File Prior to Visit  Medication Sig Dispense Refill  . aspirin 325 MG tablet Take 325 mg by mouth daily.        . Calcium Citrate-Vitamin D (CITRACAL/VITAMIN D) 250-200 MG-UNIT TABS Take 1 tablet by mouth 2 (two) times daily.  60 each  11  . donepezil (ARICEPT) 10 MG tablet TAKE ONE TABLET BY MOUTH AT BEDTIME. (DEMENTIA)  30 tablet  11  . methocarbamol (ROBAXIN) 500 MG tablet 1 tab at bedtime as needed and may repeat dose in 1 hour if no improvement, max 2 pills in 24 hours  60 tablet  11  . Nutritional Supplements (NUTRITIONAL SUPPLEMENT PLUS PO) Take 1 capsule by mouth daily.      . traZODone (DESYREL) 50 MG tablet Take 50 mg by mouth at bedtime.        No current facility-administered medications on file prior to visit.    Allergies  Allergen Reactions  . Melatonin     Causes bad dreams  . Rofecoxib     REACTION: makes feet and ankles swell  . Trazodone And Nefazodone Other (See Comments)    nightmares    Past Medical History  Diagnosis Date  . GERD (gastroesophageal reflux disease)   . Hyperlipidemia   . Hypertension   . Osteoporosis   . Pre-syncope   . Raynaud's phenomenon   . Migraine   . Lung nodule     chronic, left  . OSA (obstructive sleep apnea)   . Chest pain 3/13    Myoview stress test  negative    Past Surgical History  Procedure Laterality Date  . Appendectomy    . Hemorrhoid surgery  1961  . Abdominal hysterectomy    . Tonsillectomy and adenoidectomy  1936  . Breast biopsy  1988    benign  . Tumor excision  1958    benign tumor  . Knee arthroscopy  1970    right knee  . Esophageal dilation    . Uvulopalatopharyngoplasty  2001    for sleep apnea  . Patella fracture surgery  2007    repair of fractured right patella  . Colon surgery      Family History  Problem Relation Age of Onset  . Heart disease Mother   . Diabetes Mother   . Alzheimer's disease Sister   . Heart disease Brother     History   Social History  . Marital Status: Widowed    Spouse Name: N/A    Number of Children: 2  . Years of Education: N/A   Occupational History  . retired Charity fundraiser    Social History Main Topics  . Smoking status: Never Smoker   . Smokeless tobacco: Never Used  . Alcohol Use: No  . Drug Use: Not on  file  . Sexual Activity: Not on file   Other Topics Concern  . Not on file   Social History Narrative   Has living will, son Roney Jaffe is her healthcare POA. Has DNR order and wants to continue it. Would not accept tube feeds   Review of Systems Bowels slow at times--- bran and prune juice do help No trouble voiding Appetite is good Weight is stable     Objective:   Physical Exam  Constitutional: She appears well-developed and well-nourished. No distress.  Neck: Normal range of motion. Neck supple. No thyromegaly present.  Cardiovascular: Normal rate, regular rhythm and normal heart sounds.  Exam reveals no gallop.   No murmur heard. Pulmonary/Chest: Effort normal and breath sounds normal. No respiratory distress. She has no wheezes. She has no rales.  Abdominal: Soft. There is no tenderness.  Musculoskeletal: She exhibits no edema and no tenderness.  Lymphadenopathy:    She has no cervical adenopathy.  Psychiatric: She has a normal mood and affect. Her  behavior is normal.          Assessment & Plan:

## 2013-03-20 NOTE — Assessment & Plan Note (Signed)
Does well with the muscle relaxer 2-3 times per week No apparent side effects

## 2013-06-26 ENCOUNTER — Non-Acute Institutional Stay: Payer: Medicare Other | Admitting: Internal Medicine

## 2013-06-26 ENCOUNTER — Encounter: Payer: Medicare Other | Admitting: Internal Medicine

## 2013-06-26 ENCOUNTER — Encounter: Payer: Self-pay | Admitting: Internal Medicine

## 2013-06-26 VITALS — BP 110/60 | HR 62 | Temp 98.0°F | Resp 16 | Wt 119.0 lb

## 2013-06-26 DIAGNOSIS — I10 Essential (primary) hypertension: Secondary | ICD-10-CM

## 2013-06-26 DIAGNOSIS — M1711 Unilateral primary osteoarthritis, right knee: Secondary | ICD-10-CM

## 2013-06-26 DIAGNOSIS — G3184 Mild cognitive impairment, so stated: Secondary | ICD-10-CM | POA: Diagnosis not present

## 2013-06-26 DIAGNOSIS — R252 Cramp and spasm: Secondary | ICD-10-CM | POA: Diagnosis not present

## 2013-06-26 DIAGNOSIS — K219 Gastro-esophageal reflux disease without esophagitis: Secondary | ICD-10-CM | POA: Diagnosis not present

## 2013-06-26 DIAGNOSIS — M1710 Unilateral primary osteoarthritis, unspecified knee: Secondary | ICD-10-CM

## 2013-06-26 DIAGNOSIS — IMO0002 Reserved for concepts with insufficient information to code with codable children: Secondary | ICD-10-CM

## 2013-06-26 NOTE — Assessment & Plan Note (Signed)
BP Readings from Last 3 Encounters:  06/26/13 110/60  03/20/13 100/40  12/26/12 120/68   Good control without meds

## 2013-06-26 NOTE — Assessment & Plan Note (Signed)
Has improved also Tries to walk regularly

## 2013-06-26 NOTE — Assessment & Plan Note (Signed)
No heartburn problems lately

## 2013-06-26 NOTE — Assessment & Plan Note (Signed)
Have settled down Hasn't needed the med recently

## 2013-06-26 NOTE — Progress Notes (Signed)
Subjective:    Patient ID: Holly Koch, female    DOB: [birth year], 80 y.o.   MRN: [id number]  HPI Reviewed status with Angelica ChessmanMandy RN No new concerns  She remains very happy  Tries to walk regularly Tends to spend time with AL residents now---not as much with prior friends  Mild memory problems persist No apparent progression Continues to be independent with personal care No mood problems  Has not had much trouble with the leg cramps lately Still may rarely need the methocarbamol No sig arthritis pain---mild stiffness in fingers only  No chest pain No SOB No dizziness or syncope Occasional mild evening edema---always gone in the AM  Current Outpatient Prescriptions on File Prior to Visit  Medication Sig Dispense Refill  . aspirin 325 MG tablet Take 325 mg by mouth daily.        . Calcium Citrate-Vitamin D (CITRACAL/VITAMIN D) 250-200 MG-UNIT TABS Take 1 tablet by mouth 2 (two) times daily.  60 each  11  . donepezil (ARICEPT) 10 MG tablet TAKE ONE TABLET BY MOUTH AT BEDTIME. (DEMENTIA)  30 tablet  11  . methocarbamol (ROBAXIN) 500 MG tablet 1 tab at bedtime as needed and may repeat dose in 1 hour if no improvement, max 2 pills in 24 hours  60 tablet  11  . Nutritional Supplements (NUTRITIONAL SUPPLEMENT PLUS PO) Take 1 capsule by mouth daily.      . traZODone (DESYREL) 50 MG tablet Take 50 mg by mouth at bedtime.        No current facility-administered medications on file prior to visit.    Allergies  Allergen Reactions  . Melatonin     Causes bad dreams  . Rofecoxib     REACTION: makes feet and ankles swell  . Trazodone And Nefazodone Other (See Comments)    nightmares    Past Medical History  Diagnosis Date  . GERD (gastroesophageal reflux disease)   . Hyperlipidemia   . Hypertension   . Osteoporosis   . Pre-syncope   . Raynaud's phenomenon   . Migraine   . Lung nodule     chronic, left  . OSA (obstructive sleep apnea)   . Chest pain 3/13    Myoview stress  test negative    Past Surgical History  Procedure Laterality Date  . Appendectomy    . Hemorrhoid surgery  1961  . Abdominal hysterectomy    . Tonsillectomy and adenoidectomy  1936  . Breast biopsy  1988    benign  . Tumor excision  1958    benign tumor  . Knee arthroscopy  1970    right knee  . Esophageal dilation    . Uvulopalatopharyngoplasty  2001    for sleep apnea  . Patella fracture surgery  2007    repair of fractured right patella  . Colon surgery      Family History  Problem Relation Age of Onset  . Heart disease Mother   . Diabetes Mother   . Alzheimer's disease Sister   . Heart disease Brother     History   Social History  . Marital Status: Widowed    Spouse Name: N/A    Number of Children: 2  . Years of Education: N/A   Occupational History  . 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 Smoking status: Never Smoker   . Smokeless tobacco: Never Used  . Alcohol Use: No  . Drug Use: Not on file  . Sexual Activity:  Not on file   Other Topics Concern  . Not on file   Social History Narrative   Has living will, son Roney Jaffe is her healthcare POA. Has DNR order and wants to continue it. Would not accept tube feeds   Review of Systems Appetite is good Weight stable Sleeps well No problems with bowels---rare constipation and MOM will help (rarely)     Objective:   Physical Exam  Constitutional: She appears well-developed and well-nourished. No distress.  Neck: Normal range of motion. Neck supple. No thyromegaly present.  Cardiovascular: Normal rate, regular rhythm and normal heart sounds.  Exam reveals no gallop.   No murmur heard. Pulmonary/Chest: Effort normal and breath sounds normal. No respiratory distress. She has no wheezes. She has no rales.  Abdominal: Soft. There is no tenderness.  Musculoskeletal: She exhibits no edema and no tenderness.  Lymphadenopathy:    She has no cervical adenopathy.  Neurological:  Repeated herself  some--appropriate conversation  Skin: No rash noted.  Psychiatric: She has a normal mood and affect. Her behavior is normal.          Assessment & Plan:

## 2013-06-26 NOTE — Assessment & Plan Note (Signed)
Hasn't lost function  Will continue the donepezil

## 2013-09-18 ENCOUNTER — Non-Acute Institutional Stay: Payer: Medicare Other | Admitting: Internal Medicine

## 2013-09-18 ENCOUNTER — Encounter: Payer: Self-pay | Admitting: Internal Medicine

## 2013-09-18 VITALS — BP 110/60 | HR 62 | Temp 97.0°F | Resp 18 | Wt 119.0 lb

## 2013-09-18 DIAGNOSIS — I10 Essential (primary) hypertension: Secondary | ICD-10-CM

## 2013-09-18 DIAGNOSIS — G3184 Mild cognitive impairment, so stated: Secondary | ICD-10-CM | POA: Diagnosis not present

## 2013-09-18 DIAGNOSIS — G479 Sleep disorder, unspecified: Secondary | ICD-10-CM | POA: Diagnosis not present

## 2013-09-18 DIAGNOSIS — R252 Cramp and spasm: Secondary | ICD-10-CM | POA: Diagnosis not present

## 2013-09-18 NOTE — Assessment & Plan Note (Signed)
BP Readings from Last 3 Encounters:  09/18/13 110/60  06/26/13 110/60  03/20/13 100/40   Good control without meds

## 2013-09-18 NOTE — Progress Notes (Signed)
Subjective:    Patient ID: Holly ChafeNancy Koch, female    DOB: 30-Jun-1926, 80 y.o.   MRN: [id number]  HPI Reviewed status with Holly ChessmanMandy Koch here No new staff concerns  She is doing well Continues to be thankful for being here  Still gets the leg cramps at times Calls for the methocarbamol in the middle of the night every 1-2 weeks No apparent side effects and it does help the cramps No obvious precipitating factors Continues to walk after just about every meal---does the exercise class also Participates with most or all of the activities  Generally sleeps fine Continues on the trazodone  meory seems about the same Continues to be independent with ADLs Has to "think extra hard" about things Son handles the bills  No chest pain No SOB No dizziness or syncope  Current Outpatient Prescriptions on File Prior to Visit  Medication Sig Dispense Refill  . aspirin 325 MG tablet Take 325 mg by mouth daily.        Marland Kitchen. donepezil (ARICEPT) 10 MG tablet TAKE ONE TABLET BY MOUTH AT BEDTIME. (DEMENTIA)  30 tablet  11  . methocarbamol (ROBAXIN) 500 MG tablet 1 tab at bedtime as needed and may repeat dose in 1 hour if no improvement, max 2 pills in 24 hours  60 tablet  11  . Nutritional Supplements (NUTRITIONAL SUPPLEMENT PLUS PO) Take 1 capsule by mouth daily.      . traZODone (DESYREL) 50 MG tablet Take 50 mg by mouth at bedtime.        No current facility-administered medications on file prior to visit.    Allergies  Allergen Reactions  . Melatonin     Causes bad dreams  . Rofecoxib     REACTION: makes feet and ankles swell  . Trazodone And Nefazodone Other (See Comments)    nightmares    Past Medical History  Diagnosis Date  . GERD (gastroesophageal reflux disease)   . Hyperlipidemia   . Hypertension   . Osteoporosis   . Pre-syncope   . Raynaud's phenomenon   . Migraine   . Lung nodule     chronic, left  . OSA (obstructive sleep apnea)   . Chest pain 3/13    Myoview stress  test negative    Past Surgical History  Procedure Laterality Date  . Appendectomy    . Hemorrhoid surgery  1961  . Abdominal hysterectomy    . Tonsillectomy and adenoidectomy  1936  . Breast biopsy  1988    benign  . Tumor excision  1958    benign tumor  . Knee arthroscopy  1970    right knee  . Esophageal dilation    . Uvulopalatopharyngoplasty  2001    for sleep apnea  . Patella fracture surgery  2007    repair of fractured right patella  . Colon surgery      Family History  Problem Relation Age of Onset  . Heart disease Mother   . Diabetes Mother   . Alzheimer's disease Sister   . Heart disease Brother     History   Social History  . Marital Status: Widowed    Spouse Name: N/A    Number of Children: 2  . Years of Education: N/A   Occupational History  . 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 Smoking status: Never Smoker   . Smokeless tobacco: Never Used  . Alcohol Use: No  . Drug Use: Not on file  .  Sexual Activity: Not on file   Other Topics Concern  . Not on file   Social History Narrative   Has living will, son Holly Koch is her healthcare POA. Has DNR order and wants to continue it. Would not accept tube feeds   Review of Systems Appetite is fine Weight is stable Bowels are fine No heartburn or swallowing problems    Objective:   Physical Exam  Constitutional: She appears well-developed and well-nourished. No distress.  Neck: Normal range of motion. Neck supple. No thyromegaly present.  Cardiovascular: Normal rate, regular rhythm and normal heart sounds.  Exam reveals no gallop.   No murmur heard. Pulmonary/Chest: Effort normal and breath sounds normal. No respiratory distress. She has no wheezes. She has no rales.  Abdominal: Soft. There is no tenderness.  Musculoskeletal: She exhibits no edema and no tenderness.  Lymphadenopathy:    She has no cervical adenopathy.  Psychiatric: She has a normal mood and affect. Her behavior is  normal.          Assessment & Plan:

## 2013-09-18 NOTE — Assessment & Plan Note (Signed)
Doing well with the trazodone 

## 2013-09-18 NOTE — Assessment & Plan Note (Signed)
No change in cognition or function Will continue the donepezil in case it is helping

## 2013-09-18 NOTE — Assessment & Plan Note (Signed)
Occasional leg cramps Able to use the methocarbamol rarely with good results

## 2013-09-19 ENCOUNTER — Telehealth: Payer: Self-pay | Admitting: Internal Medicine

## 2013-09-19 NOTE — Telephone Encounter (Signed)
Relevant patient education mailed to patient.  

## 2014-01-15 ENCOUNTER — Encounter: Payer: Self-pay | Admitting: Internal Medicine

## 2014-01-15 ENCOUNTER — Non-Acute Institutional Stay: Payer: Medicare Other | Admitting: Internal Medicine

## 2014-01-15 VITALS — BP 130/78 | HR 70 | Temp 97.8°F | Resp 16 | Wt 119.0 lb

## 2014-01-15 DIAGNOSIS — G3184 Mild cognitive impairment, so stated: Secondary | ICD-10-CM

## 2014-01-15 DIAGNOSIS — M1711 Unilateral primary osteoarthritis, right knee: Secondary | ICD-10-CM

## 2014-01-15 DIAGNOSIS — L259 Unspecified contact dermatitis, unspecified cause: Secondary | ICD-10-CM | POA: Diagnosis not present

## 2014-01-15 DIAGNOSIS — M1710 Unilateral primary osteoarthritis, unspecified knee: Secondary | ICD-10-CM

## 2014-01-15 DIAGNOSIS — I10 Essential (primary) hypertension: Secondary | ICD-10-CM

## 2014-01-15 DIAGNOSIS — G479 Sleep disorder, unspecified: Secondary | ICD-10-CM | POA: Diagnosis not present

## 2014-01-15 DIAGNOSIS — K219 Gastro-esophageal reflux disease without esophagitis: Secondary | ICD-10-CM

## 2014-01-15 DIAGNOSIS — L309 Dermatitis, unspecified: Secondary | ICD-10-CM | POA: Insufficient documentation

## 2014-01-15 DIAGNOSIS — R252 Cramp and spasm: Secondary | ICD-10-CM

## 2014-01-15 MED ORDER — HYDROCORTISONE 2.5 % EX CREA: TOPICAL_CREAM | Freq: Three times a day (TID) | CUTANEOUS | Status: DC | PRN

## 2014-01-15 NOTE — Assessment & Plan Note (Signed)
No decline Continues to do well in AL

## 2014-01-15 NOTE — Addendum Note (Signed)
Addended by: Tillman Abide I on: 01/15/2014 11:54 AM   Modules accepted: Orders

## 2014-01-15 NOTE — Assessment & Plan Note (Signed)
BP Readings from Last 3 Encounters:  01/15/14 130/78  09/18/13 110/60  06/26/13 110/60   Still fine without Rx

## 2014-01-15 NOTE — Assessment & Plan Note (Signed)
Mild stiffness and pain Not disabling

## 2014-01-15 NOTE — Assessment & Plan Note (Signed)
Less frequent Rarely uses muscle relaxer

## 2014-01-15 NOTE — Assessment & Plan Note (Signed)
Fine with the trazodone

## 2014-01-15 NOTE — Assessment & Plan Note (Signed)
Scattered She denies itching now Not scabitic or apparent infection Will try cortisone cream---hold off on derm per her preference (and not worrisome)

## 2014-01-15 NOTE — Progress Notes (Signed)
Subjective:    Patient ID: Holly Koch, female    DOB: Oct 14, 1926, 80 y.o.   MRN: [id number]  HPI Doing well Reviewed status with Holly Chessman RN  Has had some rough spots on forehead, arms and legs She denies itching but staff note her scratching Seems some better with lotion and shower assistance Has derm appt for tomorrow  Ongoing memory problems Doesn't seem any worse Remains appreciative of the assistance of staff here  Still gets night leg cramps---rare though The methocarbamol still is helpful  Still with right knee pain at times---stiff also Tries to walk daily Participates in exercise classes still  Stomach is fine No heartburn or dysphagia  Current Outpatient Prescriptions on File Prior to Visit  Medication Sig Dispense Refill  . aspirin 325 MG tablet Take 325 mg by mouth daily.        . Calcium Citrate-Vitamin D 250-200 MG-UNIT TABS Take 2 tablets by mouth 2 (two) times daily.      Marland Kitchen donepezil (ARICEPT) 10 MG tablet TAKE ONE TABLET BY MOUTH AT BEDTIME. (DEMENTIA)  30 tablet  11  . methocarbamol (ROBAXIN) 500 MG tablet 1 tab at bedtime as needed and may repeat dose in 1 hour if no improvement, max 2 pills in 24 hours  60 tablet  11  . Multiple Vitamins-Calcium (ONE-A-DAY WOMENS FORMULA) TABS Take 1 tablet by mouth daily.      . Nutritional Supplements (NUTRITIONAL SUPPLEMENT PLUS PO) Take 1 capsule by mouth daily.      . traZODone (DESYREL) 50 MG tablet Take 50 mg by mouth at bedtime.        No current facility-administered medications on file prior to visit.    Allergies  Allergen Reactions  . Melatonin     Causes bad dreams  . Rofecoxib     REACTION: makes feet and ankles swell  . Trazodone And Nefazodone Other (See Comments)    nightmares    Past Medical History  Diagnosis Date  . GERD (gastroesophageal reflux disease)   . Hyperlipidemia   . Hypertension   . Osteoporosis   . Pre-syncope   . Raynaud's phenomenon   . Migraine   . Lung nodule    chronic, left  . OSA (obstructive sleep apnea)   . Chest pain 3/13    Myoview stress test negative    Past Surgical History  Procedure Laterality Date  . Appendectomy    . Hemorrhoid surgery  1961  . Abdominal hysterectomy    . Tonsillectomy and adenoidectomy  1936  . Breast biopsy  1988    benign  . Tumor excision  1958    benign tumor  . Knee arthroscopy  1970    right knee  . Esophageal dilation    . Uvulopalatopharyngoplasty  2001    for sleep apnea  . Patella fracture surgery  2007    repair of fractured right patella  . Colon surgery      Family History  Problem Relation Age of Onset  . Heart disease Mother   . Diabetes Mother   . Alzheimer's disease Sister   . Heart disease Brother     History   Social History  . Marital Status: Widowed    Spouse Name: N/A    Number of Children: 2  . Years of Education: N/A   Occupational History  . retired Charity fundraiser    Social History Main Topics  . Smoking status: Never Smoker   . Smokeless tobacco: Never Used  .  Alcohol Use: No  . Drug Use: Not on file  . Sexual Activity: Not on file   Other Topics Concern  . Not on file   Social History Narrative   Has living will, son Holly Koch is her healthcare POA. Has DNR order and wants to continue it. Would not accept tube feeds   Review of Systems Appetite is good Weight is stable Sleeps well Mood remains good    Objective:   Physical Exam  Constitutional: She appears well-developed and well-nourished. No distress.  Neck: Normal range of motion. Neck supple. No thyromegaly present.  Cardiovascular: Normal rate, regular rhythm and normal heart sounds.  Exam reveals no gallop.   No murmur heard. Pulmonary/Chest: Effort normal and breath sounds normal. No respiratory distress. She has no wheezes. She has no rales.  Abdominal: Soft. There is no tenderness.  Musculoskeletal: She exhibits no edema and no tenderness.  Lymphadenopathy:    She has no cervical adenopathy.    Skin:  Several macular excoriated areas. Slight surrounding redness--esp on extremities but not inflamed. No induration No burrows, hand lesions, etc  Psychiatric: She has a normal mood and affect. Her behavior is normal.          Assessment & Plan:

## 2014-01-15 NOTE — Assessment & Plan Note (Signed)
No current symptoms

## 2014-02-19 DIAGNOSIS — Z23 Encounter for immunization: Secondary | ICD-10-CM | POA: Diagnosis not present

## 2014-04-21 DIAGNOSIS — R05 Cough: Secondary | ICD-10-CM | POA: Diagnosis not present

## 2014-04-21 DIAGNOSIS — R0989 Other specified symptoms and signs involving the circulatory and respiratory systems: Secondary | ICD-10-CM | POA: Diagnosis not present

## 2014-04-22 ENCOUNTER — Non-Acute Institutional Stay: Payer: Medicare Other | Admitting: Internal Medicine

## 2014-04-22 ENCOUNTER — Encounter: Payer: Self-pay | Admitting: Internal Medicine

## 2014-04-22 VITALS — BP 117/44 | HR 62 | Temp 98.6°F | Resp 16 | Wt 122.0 lb

## 2014-04-22 DIAGNOSIS — J209 Acute bronchitis, unspecified: Secondary | ICD-10-CM | POA: Diagnosis not present

## 2014-04-22 MED ORDER — AZITHROMYCIN 250 MG PO TABS: ORAL_TABLET | ORAL | Status: DC

## 2014-04-22 NOTE — Progress Notes (Signed)
HPI  Asked to evaluate resident in [address] Cough and chest congestion x 1 week No fever, chills or body aches Slight headache, cough productive of green mucous No chest pain or SOB Has been trying Robitussin and Tylenol without relief.  No history of allergies or breathing problems Has had sick contacts.  Review of Systems      Past Medical History  Diagnosis Date  . GERD (gastroesophageal reflux disease)   . Hyperlipidemia   . Hypertension   . Osteoporosis   . Pre-syncope   . Raynaud's phenomenon   . Migraine   . Lung nodule     chronic, left  . OSA (obstructive sleep apnea)   . Chest pain 3/13    Myoview stress test negative    Family History  Problem Relation Age of Onset  . Heart disease Mother   . Diabetes Mother   . Alzheimer's disease Sister   . Heart disease Brother     History   Social History  . Marital Status: Widowed    Spouse Name: N/A    Number of Children: 2  . Years of Education: N/A   Occupational History  . 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 Smoking status: Never Smoker   . Smokeless tobacco: Never Used  . Alcohol Use: No  . Drug Use: Not on file  . Sexual Activity: Not on file   Other Topics Concern  . Not on file   Social History Narrative   Has living will, son Roney Jaffendy Young is her healthcare POA. Has DNR order and wants to continue it. Would not accept tube feeds    Allergies  Allergen Reactions  . Melatonin     Causes bad dreams  . Rofecoxib     REACTION: makes feet and ankles swell  . Trazodone And Nefazodone Other (See Comments)    nightmares     Constitutional: Positive headache, fatigueDenies fever or abrupt weight changes.  HEENT:  Positive sore throat. Denies eye redness, eye pain, pressure behind the eyes, facial pain, nasal congestion, ear pain, ringing in the ears, wax buildup, runny nose or bloody nose. Respiratory: Positive cough. Denies difficulty breathing or shortness of breath.  Cardiovascular:  Denies chest pain, chest tightness, palpitations or swelling in the hands or feet.   No other specific complaints in a complete review of systems (except as listed in HPI above).  Objective:   BP 117/44 mmHg  Pulse 62  Temp(Src) 98.6 F (37 C)  Resp 16  Wt 122 lb (55.339 kg) Wt Readings from Last 3 Encounters:  04/22/14 122 lb (55.339 kg)  01/15/14 119 lb (53.978 kg)  09/18/13 119 lb (53.978 kg)     General: Appears her stated age, ill appearing in NAD. HEENT: Head: normal shape and size, no sinus tenderness noted; Throat/Mouth:  Teeth present, mucosa erythematous and moist, no exudate noted, no lesions or ulcerations noted.  Neck: No cervical adenopathy noted Cardiovascular: Normal rate and rhythm. S1,S2 noted.  No murmur, rubs or gallops noted.  Pulmonary/Chest: Normal effort and scattered rhonchi throughout. No respiratory distress. No wheezes, rales or noted.      Assessment & Plan:   Acute Bronchitis:  Get some rest and drink plenty of water Do salt water gargles for the sore throat eRx for Azithromax x 5 days Continue Robitussin and Tylenol   RTC as needed or if symptoms persist.

## 2014-04-24 DIAGNOSIS — R002 Palpitations: Secondary | ICD-10-CM | POA: Diagnosis not present

## 2014-04-24 DIAGNOSIS — R0689 Other abnormalities of breathing: Secondary | ICD-10-CM | POA: Diagnosis not present

## 2014-04-26 ENCOUNTER — Inpatient Hospital Stay: Payer: Self-pay | Admitting: Internal Medicine

## 2014-04-26 DIAGNOSIS — J45901 Unspecified asthma with (acute) exacerbation: Secondary | ICD-10-CM | POA: Diagnosis not present

## 2014-04-26 DIAGNOSIS — G4700 Insomnia, unspecified: Secondary | ICD-10-CM | POA: Diagnosis present

## 2014-04-26 DIAGNOSIS — J440 Chronic obstructive pulmonary disease with acute lower respiratory infection: Secondary | ICD-10-CM | POA: Diagnosis present

## 2014-04-26 DIAGNOSIS — J449 Chronic obstructive pulmonary disease, unspecified: Secondary | ICD-10-CM | POA: Diagnosis not present

## 2014-04-26 DIAGNOSIS — J209 Acute bronchitis, unspecified: Secondary | ICD-10-CM | POA: Diagnosis present

## 2014-04-26 DIAGNOSIS — J441 Chronic obstructive pulmonary disease with (acute) exacerbation: Secondary | ICD-10-CM | POA: Diagnosis present

## 2014-04-26 DIAGNOSIS — K219 Gastro-esophageal reflux disease without esophagitis: Secondary | ICD-10-CM | POA: Diagnosis present

## 2014-04-26 DIAGNOSIS — M6281 Muscle weakness (generalized): Secondary | ICD-10-CM | POA: Diagnosis not present

## 2014-04-26 DIAGNOSIS — R739 Hyperglycemia, unspecified: Secondary | ICD-10-CM | POA: Diagnosis not present

## 2014-04-26 DIAGNOSIS — I10 Essential (primary) hypertension: Secondary | ICD-10-CM | POA: Diagnosis not present

## 2014-04-26 DIAGNOSIS — J9601 Acute respiratory failure with hypoxia: Secondary | ICD-10-CM | POA: Diagnosis not present

## 2014-04-26 DIAGNOSIS — F0390 Unspecified dementia without behavioral disturbance: Secondary | ICD-10-CM | POA: Diagnosis present

## 2014-04-26 DIAGNOSIS — I2510 Atherosclerotic heart disease of native coronary artery without angina pectoris: Secondary | ICD-10-CM | POA: Diagnosis not present

## 2014-04-26 DIAGNOSIS — J9811 Atelectasis: Secondary | ICD-10-CM | POA: Diagnosis not present

## 2014-04-26 DIAGNOSIS — J189 Pneumonia, unspecified organism: Secondary | ICD-10-CM | POA: Diagnosis not present

## 2014-04-26 DIAGNOSIS — R918 Other nonspecific abnormal finding of lung field: Secondary | ICD-10-CM | POA: Diagnosis not present

## 2014-04-26 DIAGNOSIS — J9809 Other diseases of bronchus, not elsewhere classified: Secondary | ICD-10-CM | POA: Diagnosis not present

## 2014-04-26 DIAGNOSIS — R0902 Hypoxemia: Secondary | ICD-10-CM | POA: Diagnosis present

## 2014-04-26 LAB — URINALYSIS, COMPLETE
BLOOD: NEGATIVE
Glucose,UR: NEGATIVE mg/dL (ref 0–75)
Hyaline Cast: 14
Ketone: NEGATIVE
Leukocyte Esterase: NEGATIVE
Nitrite: NEGATIVE
PH: 5 (ref 4.5–8.0)
RBC,UR: 1 /HPF (ref 0–5)
Specific Gravity: 1.032 (ref 1.003–1.030)
Squamous Epithelial: 1

## 2014-04-26 LAB — BASIC METABOLIC PANEL
Anion Gap: 9 (ref 7–16)
BUN: 14 mg/dL (ref 7–18)
CHLORIDE: 109 mmol/L — AB (ref 98–107)
CREATININE: 0.87 mg/dL (ref 0.60–1.30)
Calcium, Total: 9 mg/dL (ref 8.5–10.1)
Co2: 26 mmol/L (ref 21–32)
GLUCOSE: 131 mg/dL — AB (ref 65–99)
Osmolality: 289 (ref 275–301)
Potassium: 3.5 mmol/L (ref 3.5–5.1)
Sodium: 144 mmol/L (ref 136–145)

## 2014-04-26 LAB — CBC WITH DIFFERENTIAL/PLATELET
BASOS ABS: 0.1 10*3/uL (ref 0.0–0.1)
BASOS PCT: 1.3 %
Eosinophil #: 0.2 10*3/uL (ref 0.0–0.7)
Eosinophil %: 2.4 %
HCT: 41.4 % (ref 35.0–47.0)
HGB: 13.2 g/dL (ref 12.0–16.0)
LYMPHS PCT: 9 %
Lymphocyte #: 0.7 10*3/uL — ABNORMAL LOW (ref 1.0–3.6)
MCH: 30.1 pg (ref 26.0–34.0)
MCHC: 31.9 g/dL — AB (ref 32.0–36.0)
MCV: 94 fL (ref 80–100)
MONO ABS: 0.7 x10 3/mm (ref 0.2–0.9)
MONOS PCT: 8.2 %
Neutrophil #: 6.5 10*3/uL (ref 1.4–6.5)
Neutrophil %: 79.1 %
Platelet: 263 10*3/uL (ref 150–440)
RBC: 4.39 10*6/uL (ref 3.80–5.20)
RDW: 14 % (ref 11.5–14.5)
WBC: 8.2 10*3/uL (ref 3.6–11.0)

## 2014-04-26 LAB — TROPONIN I

## 2014-04-26 LAB — HEMOGLOBIN A1C: HEMOGLOBIN A1C: 5.1 % (ref 4.2–6.3)

## 2014-04-27 DIAGNOSIS — J441 Chronic obstructive pulmonary disease with (acute) exacerbation: Secondary | ICD-10-CM | POA: Diagnosis not present

## 2014-04-27 DIAGNOSIS — J189 Pneumonia, unspecified organism: Secondary | ICD-10-CM | POA: Diagnosis not present

## 2014-04-27 DIAGNOSIS — R739 Hyperglycemia, unspecified: Secondary | ICD-10-CM | POA: Diagnosis not present

## 2014-04-27 DIAGNOSIS — J209 Acute bronchitis, unspecified: Secondary | ICD-10-CM | POA: Diagnosis not present

## 2014-04-28 DIAGNOSIS — J441 Chronic obstructive pulmonary disease with (acute) exacerbation: Secondary | ICD-10-CM | POA: Diagnosis not present

## 2014-04-28 DIAGNOSIS — J209 Acute bronchitis, unspecified: Secondary | ICD-10-CM | POA: Diagnosis not present

## 2014-04-28 DIAGNOSIS — R739 Hyperglycemia, unspecified: Secondary | ICD-10-CM | POA: Diagnosis not present

## 2014-04-28 DIAGNOSIS — J189 Pneumonia, unspecified organism: Secondary | ICD-10-CM | POA: Diagnosis not present

## 2014-04-28 LAB — URINE CULTURE

## 2014-04-29 ENCOUNTER — Encounter: Payer: Self-pay | Admitting: Internal Medicine

## 2014-04-29 ENCOUNTER — Non-Acute Institutional Stay: Payer: Medicare Other | Admitting: Internal Medicine

## 2014-04-29 VITALS — BP 136/64 | HR 61 | Resp 18

## 2014-04-29 DIAGNOSIS — J209 Acute bronchitis, unspecified: Secondary | ICD-10-CM

## 2014-04-29 LAB — EXPECTORATED SPUTUM ASSESSMENT W GRAM STAIN, RFLX TO RESP C

## 2014-04-29 NOTE — Progress Notes (Signed)
Subjective:    Patient ID: Holly Koch, female    DOB: 1926/10/15, 80 y.o.   MRN: [id number]  HPI  Asked to evaluate resident in [address] for hospital follow up Diagnosed with acute bronchitis, gave zpack for another 5 days and put on prednisone taper She also came back with schedule Mucinex and multiple inhalers- no previous lung history She reports she is feeling much better Cough has improved- no shortness of breath Feels a little weak but appetite good.  Review of Systems      Past Medical History  Diagnosis Date  . GERD (gastroesophageal reflux disease)   . Hyperlipidemia   . Hypertension   . Osteoporosis   . Pre-syncope   . Raynaud's phenomenon   . Migraine   . Lung nodule     chronic, left  . OSA (obstructive sleep apnea)   . Chest pain 3/13    Myoview stress test negative    Current Outpatient Prescriptions  Medication Sig Dispense Refill  . aspirin 81 MG tablet Take 81 mg by mouth daily.    Marland Kitchen. azithromycin (ZITHROMAX) 250 MG tablet Take 2 tabs today, then 1 tab daily x 4 days 6 tablet 0  . Calcium Citrate-Vitamin D 250-200 MG-UNIT TABS Take 2 tablets by mouth 2 (two) times daily.    Marland Kitchen. donepezil (ARICEPT) 10 MG tablet TAKE ONE TABLET BY MOUTH AT BEDTIME. (DEMENTIA) 30 tablet 11  . hydrocortisone 2.5 % cream Apply topically 3 (three) times daily as needed. To skin lesions till clear 30 g 5  . Multiple Vitamins-Calcium (ONE-A-DAY WOMENS FORMULA) TABS Take 1 tablet by mouth daily.    . Nutritional Supplements (NUTRITIONAL SUPPLEMENT PLUS PO) Take 1 capsule by mouth daily.    . predniSONE (STERAPRED UNI-PAK) 10 MG tablet Take 10 tablets by mouth daily.    . traZODone (DESYREL) 50 MG tablet Take 50 mg by mouth at bedtime.     . methocarbamol (ROBAXIN) 500 MG tablet 1 tab at bedtime as needed and may repeat dose in 1 hour if no improvement, max 2 pills in 24 hours 60 tablet 11   No current facility-administered medications for this visit.    Allergies    Allergen Reactions  . Melatonin     Causes bad dreams  . Rofecoxib     REACTION: makes feet and ankles swell  . Trazodone And Nefazodone Other (See Comments)    nightmares    Family History  Problem Relation Age of Onset  . Heart disease Mother   . Diabetes Mother   . Alzheimer's disease Sister   . Heart disease Brother     History   Social History  . Marital Status: Married    Spouse Name: N/A    Number of Children: 2  . Years of Education: N/A   Occupational History  . 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 Smoking status: Never Smoker   . Smokeless tobacco: Never Used  . Alcohol Use: No  . Drug Use: Not on file  . Sexual Activity: Not on file   Other Topics Concern  . Not on file   Social History Narrative   Has living will, son Roney Jaffendy Young is her healthcare POA. Has DNR order and wants to continue it. Would not accept tube feeds     Constitutional: Pt reports fatigue. Denies fever, malaise, headache or abrupt weight changes.  HEENT: Denies eye pain, eye redness, ear pain, ringing in the ears, wax  buildup, runny nose, nasal congestion, bloody nose, or sore throat. Respiratory: Pt reports cough. Denies difficulty breathing, shortness of breath, cough or sputum production.   Cardiovascular: Denies chest pain, chest tightness, palpitations or swelling in the hands or feet.    No other specific complaints in a complete review of systems (except as listed in HPI above).  Objective:   Physical Exam   BP 136/64 mmHg  Pulse 61  Resp 18 Wt Readings from Last 3 Encounters:  04/22/14 122 lb (55.339 kg)  01/15/14 119 lb (53.978 kg)  09/18/13 119 lb (53.978 kg)    General: Appears her stated age,in NAD. Skin: Warm, dry and intact.  HEENT: Head: normal shape and size; Throat/Mouth: Teeth present, mucosa pink and moist, no exudate, lesions or ulcerations noted.  Neck: No adenopathy noted. Cardiovascular: Normal rate and rhythm. S1,S2 noted.  No murmur,  rubs or gallops noted. Pulmonary/Chest: Normal effort and positive vesicular breath sounds. No respiratory distress. No wheezes, rales or ronchi noted.    BMET    Component Value Date/Time   NA 140 09/30/2010 1330   K 4.2 09/30/2010 1330   CL 104 09/30/2010 1330   CO2 28 09/30/2010 1330   GLUCOSE 92 09/30/2010 1330   BUN 22 09/30/2010 1330   CREATININE 0.6 09/30/2010 1330   CALCIUM 10.1 09/30/2010 1330   GFRNONAA 103.88 11/08/2009 1039    Lipid Panel  No results found for: CHOL, TRIG, HDL, CHOLHDL, VLDL, LDLCALC  CBC    Component Value Date/Time   WBC 6.7 09/30/2010 1330   RBC 4.45 09/30/2010 1330   HGB 14.5 09/30/2010 1330   HCT 42.3 09/30/2010 1330   PLT 214.0 09/30/2010 1330   MCV 94.9 09/30/2010 1330   MCHC 34.3 09/30/2010 1330   RDW 13.5 09/30/2010 1330   LYMPHSABS 0.8 09/30/2010 1330   MONOABS 0.5 09/30/2010 1330   EOSABS 0.1 09/30/2010 1330   BASOSABS 0.0 09/30/2010 1330    Hgb A1C No results found for: HGBA1C      Assessment & Plan:   Hospital follow up for acute bronchitis:  Improving Continue zpack and pred taper Stop all inhalers except Combivent prn Make mucinex BID prn not scheduled  Will follow up as needed

## 2014-05-01 LAB — CULTURE, BLOOD (SINGLE)

## 2014-05-04 ENCOUNTER — Ambulatory Visit: Payer: Medicare Other | Admitting: Internal Medicine

## 2014-05-06 DIAGNOSIS — M6281 Muscle weakness (generalized): Secondary | ICD-10-CM | POA: Diagnosis not present

## 2014-05-07 DIAGNOSIS — M6281 Muscle weakness (generalized): Secondary | ICD-10-CM | POA: Diagnosis not present

## 2014-05-08 DIAGNOSIS — R05 Cough: Secondary | ICD-10-CM | POA: Diagnosis not present

## 2014-05-08 DIAGNOSIS — M6281 Muscle weakness (generalized): Secondary | ICD-10-CM | POA: Diagnosis not present

## 2014-05-08 DIAGNOSIS — G479 Sleep disorder, unspecified: Secondary | ICD-10-CM | POA: Diagnosis not present

## 2014-05-08 DIAGNOSIS — R531 Weakness: Secondary | ICD-10-CM | POA: Diagnosis not present

## 2014-05-08 DIAGNOSIS — G3184 Mild cognitive impairment, so stated: Secondary | ICD-10-CM | POA: Diagnosis not present

## 2014-05-09 DIAGNOSIS — R05 Cough: Secondary | ICD-10-CM | POA: Diagnosis not present

## 2014-05-11 DIAGNOSIS — M6281 Muscle weakness (generalized): Secondary | ICD-10-CM | POA: Diagnosis not present

## 2014-05-12 DIAGNOSIS — M6281 Muscle weakness (generalized): Secondary | ICD-10-CM | POA: Diagnosis not present

## 2014-05-13 DIAGNOSIS — M6281 Muscle weakness (generalized): Secondary | ICD-10-CM | POA: Diagnosis not present

## 2014-05-14 DIAGNOSIS — M6281 Muscle weakness (generalized): Secondary | ICD-10-CM | POA: Diagnosis not present

## 2014-05-15 DIAGNOSIS — M6281 Muscle weakness (generalized): Secondary | ICD-10-CM | POA: Diagnosis not present

## 2014-05-18 DIAGNOSIS — M6281 Muscle weakness (generalized): Secondary | ICD-10-CM | POA: Diagnosis not present

## 2014-05-19 DIAGNOSIS — M6281 Muscle weakness (generalized): Secondary | ICD-10-CM | POA: Diagnosis not present

## 2014-05-20 DIAGNOSIS — M6281 Muscle weakness (generalized): Secondary | ICD-10-CM | POA: Diagnosis not present

## 2014-05-21 DIAGNOSIS — M6281 Muscle weakness (generalized): Secondary | ICD-10-CM | POA: Diagnosis not present

## 2014-05-22 DIAGNOSIS — M6281 Muscle weakness (generalized): Secondary | ICD-10-CM | POA: Diagnosis not present

## 2014-05-25 DIAGNOSIS — M6281 Muscle weakness (generalized): Secondary | ICD-10-CM | POA: Diagnosis not present

## 2014-05-26 DIAGNOSIS — M6281 Muscle weakness (generalized): Secondary | ICD-10-CM | POA: Diagnosis not present

## 2014-05-29 DIAGNOSIS — M6281 Muscle weakness (generalized): Secondary | ICD-10-CM | POA: Diagnosis not present

## 2014-05-29 DIAGNOSIS — R296 Repeated falls: Secondary | ICD-10-CM | POA: Diagnosis not present

## 2014-06-01 DIAGNOSIS — M6281 Muscle weakness (generalized): Secondary | ICD-10-CM | POA: Diagnosis not present

## 2014-06-01 DIAGNOSIS — R296 Repeated falls: Secondary | ICD-10-CM | POA: Diagnosis not present

## 2014-06-02 DIAGNOSIS — M6281 Muscle weakness (generalized): Secondary | ICD-10-CM | POA: Diagnosis not present

## 2014-06-02 DIAGNOSIS — R296 Repeated falls: Secondary | ICD-10-CM | POA: Diagnosis not present

## 2014-06-03 ENCOUNTER — Ambulatory Visit: Payer: Medicare Other | Admitting: Internal Medicine

## 2014-06-03 ENCOUNTER — Non-Acute Institutional Stay: Payer: Medicare Other | Admitting: Internal Medicine

## 2014-06-03 ENCOUNTER — Encounter: Payer: Medicare Other | Admitting: Internal Medicine

## 2014-06-03 ENCOUNTER — Encounter: Payer: Self-pay | Admitting: Internal Medicine

## 2014-06-03 VITALS — BP 132/70 | HR 73 | Temp 97.3°F | Resp 16

## 2014-06-03 DIAGNOSIS — L309 Dermatitis, unspecified: Secondary | ICD-10-CM | POA: Diagnosis not present

## 2014-06-03 MED ORDER — TRIAMCINOLONE ACETONIDE 0.1 % EX CREA: 1.0000 "application " | TOPICAL_CREAM | Freq: Two times a day (BID) | CUTANEOUS | Status: DC

## 2014-06-03 NOTE — Progress Notes (Signed)
HPI  Asked to evaluate resident in [address] for skin rash on arms and neck x 1 week. Resident is continuously scratching same areas due to itchiness. RN applied bandage to neck area to prevent resident from scratching. No exposure to new cleaning agents or lotions. Hx of dermatitis. Denies injury or bug bites. No bleeding or oozing around rash. No nausea, vomiting, fever or chills.The resident is on aspirin 81 mg, donepezil 10 mg, robaxin, vitamin supplements and using hydrocortisone cream 3x daily.    Review of Systems      Past Medical History  Diagnosis Date  . GERD (gastroesophageal reflux disease)   . Hyperlipidemia   . Hypertension   . Osteoporosis   . Pre-syncope   . Raynaud's phenomenon   . Migraine   . Lung nodule     chronic, left  . OSA (obstructive sleep apnea)   . Chest pain 3/13    Myoview stress test negative    Family History  Problem Relation Age of Onset  . Heart disease Mother   . Diabetes Mother   . Alzheimer's disease Sister   . Heart disease Brother     History   Social History  . Marital Status: Married    Spouse Name: N/A    Number of Children: 2  . Years of Education: N/A   Occupational History  . 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 Smoking status: Never Smoker   . Smokeless tobacco: Never Used  . Alcohol Use: No  . Drug Use: Not on file  . Sexual Activity: Not on file   Other Topics Concern  . Not on file   Social History Narrative   Has living will, son Roney Jaffendy Young is her healthcare POA. Has DNR order and wants to continue it. Would not accept tube feeds    Allergies  Allergen Reactions  . Melatonin     Causes bad dreams  . Rofecoxib     REACTION: makes feet and ankles swell  . Trazodone And Nefazodone Other (See Comments)    nightmares   Constitutional: Denies headache, fatigue or abrupt weight changes.  Respiratory: Denies cough, difficulty breathing or shortness of breath.  Cardiovascular: Denies chest pain,  chest tightness, palpitations or swelling in the hands or feet.  Skin: Positive itchy rash on arms and neck. Denies pain, warmth or swelling.  No other specific complaints in a complete review of systems (except as listed in HPI above).  Objective:   BP 132/70 mmHg  Pulse 73  Temp(Src) 97.3 F (36.3 C)  Resp 16 Wt Readings from Last 3 Encounters:  04/22/14 122 lb (55.339 kg)  01/15/14 119 lb (53.978 kg)  09/18/13 119 lb (53.978 kg)     General: Pleasant and engaged, concerned about rash but in NAD. Neck: Neck supple. No lymphadenopathy.  Cardiovascular: Normal rate and rhythm. S1,S2 noted.  No murmur, rubs or gallops noted.  Pulmonary/Chest: Normal effort and positive vesicular breath sounds. No respiratory distress. No wheezes, rales or ronchi noted.  Skin: Erythematous, dry, scaly antecubital regions b/l and upper left and midline neck region. No bleeding, oozing or ulcerations.      Assessment & Plan:   Dermatitis of arms and neck:  Rx triamcinolone acetonide ointment; apply ointment to affected areas BID. Avoid touching/scratching affected skin.  Will follow up as needed

## 2014-06-04 DIAGNOSIS — M6281 Muscle weakness (generalized): Secondary | ICD-10-CM | POA: Diagnosis not present

## 2014-06-04 DIAGNOSIS — R296 Repeated falls: Secondary | ICD-10-CM | POA: Diagnosis not present

## 2014-06-08 DIAGNOSIS — M6281 Muscle weakness (generalized): Secondary | ICD-10-CM | POA: Diagnosis not present

## 2014-06-08 DIAGNOSIS — R296 Repeated falls: Secondary | ICD-10-CM | POA: Diagnosis not present

## 2014-06-10 DIAGNOSIS — M6281 Muscle weakness (generalized): Secondary | ICD-10-CM | POA: Diagnosis not present

## 2014-06-10 DIAGNOSIS — R296 Repeated falls: Secondary | ICD-10-CM | POA: Diagnosis not present

## 2014-06-12 DIAGNOSIS — R296 Repeated falls: Secondary | ICD-10-CM | POA: Diagnosis not present

## 2014-06-12 DIAGNOSIS — M6281 Muscle weakness (generalized): Secondary | ICD-10-CM | POA: Diagnosis not present

## 2014-06-18 ENCOUNTER — Encounter: Payer: Self-pay | Admitting: Internal Medicine

## 2014-06-18 ENCOUNTER — Non-Acute Institutional Stay: Payer: Medicare Other | Admitting: Internal Medicine

## 2014-06-18 VITALS — BP 130/64 | HR 86 | Temp 97.6°F | Resp 16 | Wt 120.0 lb

## 2014-06-18 DIAGNOSIS — I10 Essential (primary) hypertension: Secondary | ICD-10-CM

## 2014-06-18 DIAGNOSIS — G309 Alzheimer's disease, unspecified: Secondary | ICD-10-CM | POA: Diagnosis not present

## 2014-06-18 DIAGNOSIS — F0280 Dementia in other diseases classified elsewhere without behavioral disturbance: Secondary | ICD-10-CM

## 2014-06-18 DIAGNOSIS — G479 Sleep disorder, unspecified: Secondary | ICD-10-CM

## 2014-06-18 DIAGNOSIS — K219 Gastro-esophageal reflux disease without esophagitis: Secondary | ICD-10-CM | POA: Diagnosis not present

## 2014-06-18 NOTE — Assessment & Plan Note (Signed)
With her functional decline, she has now come into the diagnosis of dementia instead of just MCI Discussed the possible need to go to Memory Care---this discussion has begun with family as well I urged her to be proactive like she was coming here-- and move before troubling problems start Continue donepezil

## 2014-06-18 NOTE — Assessment & Plan Note (Signed)
Seems better now Will stop all meds and just have prn tylenol---to reduce any possible cognitive problems with this

## 2014-06-18 NOTE — Assessment & Plan Note (Signed)
BP Readings from Last 3 Encounters:  06/18/14 130/64  06/03/14 132/70  04/29/14 136/64   Fine without meds

## 2014-06-18 NOTE — Assessment & Plan Note (Signed)
Has been quiet No dysphagia No meds needed

## 2014-06-18 NOTE — Progress Notes (Signed)
Subjective:    Patient ID: Holly Koch, female    DOB: November 14, 1926, 81 y.o.   MRN: [id number]  HPI Here for follow up after health care for pneumonia and her dementia  Feels better from the weakness from respiratory infection Walking throughout with walker, etc-- feels okay No chest pain Not coughing Some phlegm in throat--she has to clear it No SOB  Has been more confused Calling for help more often Even has had to call several times because she can't figure out the TV controls  No problems with arthritis pain Knee will get stiff some--but she notes it better with just walking  No chest pain No dizziness or syncope Occasional edema later in day--- better with elevation No headaches  Current Outpatient Prescriptions on File Prior to Visit  Medication Sig Dispense Refill  . aspirin 81 MG tablet Take 81 mg by mouth daily.    . Calcium Citrate-Vitamin D 250-200 MG-UNIT TABS Take 2 tablets by mouth 2 (two) times daily.    Marland Kitchen donepezil (ARICEPT) 10 MG tablet TAKE ONE TABLET BY MOUTH AT BEDTIME. (DEMENTIA) 30 tablet 11  . hydrocortisone 2.5 % cream Apply topically 3 (three) times daily as needed. To skin lesions till clear 30 g 5  . methocarbamol (ROBAXIN) 500 MG tablet 1 tab at bedtime as needed and may repeat dose in 1 hour if no improvement, max 2 pills in 24 hours 60 tablet 11  . Multiple Vitamins-Calcium (ONE-A-DAY WOMENS FORMULA) TABS Take 1 tablet by mouth daily.    . traZODone (DESYREL) 50 MG tablet Take 50 mg by mouth at bedtime as needed.     . triamcinolone cream (KENALOG) 0.1 % Apply 1 application topically 2 (two) times daily. 30 g 0   No current facility-administered medications on file prior to visit.    Allergies  Allergen Reactions  . Melatonin     Causes bad dreams  . Rofecoxib     REACTION: makes feet and ankles swell  . Trazodone And Nefazodone Other (See Comments)    nightmares    Past Medical History  Diagnosis Date  . GERD (gastroesophageal  reflux disease)   . Hyperlipidemia   . Hypertension   . Osteoporosis   . Pre-syncope   . Raynaud's phenomenon   . Migraine   . Lung nodule     chronic, left  . OSA (obstructive sleep apnea)   . Chest pain 3/13    Myoview stress test negative    Past Surgical History  Procedure Laterality Date  . Appendectomy    . Hemorrhoid surgery  1961  . Abdominal hysterectomy    . Tonsillectomy and adenoidectomy  1936  . Breast biopsy  1988    benign  . Tumor excision  1958    benign tumor  . Knee arthroscopy  1970    right knee  . Esophageal dilation    . Uvulopalatopharyngoplasty  2001    for sleep apnea  . Patella fracture surgery  2007    repair of fractured right patella  . Colon surgery      Family History  Problem Relation Age of Onset  . Heart disease Mother   . Diabetes Mother   . Alzheimer's disease Sister   . Heart disease Brother     History   Social History  . Marital Status: Married    Spouse Name: N/A  . Number of Children: 2  . Years of Education: N/A   Occupational History  . retired  RN    Social History Main Topics  . Smoking status: Never Smoker   . Smokeless tobacco: Never Used  . Alcohol Use: No  . Drug Use: Not on file  . Sexual Activity: Not on file   Other Topics Concern  . Not on file   Social History Narrative   Has living will, son Roney Jaffendy Young is her healthcare POA. Has DNR order and wants to continue it. Would not accept tube feeds   Review of Systems Appetite is okay Sleeping well Weight is stable Bowels are okay No recent leg cramps--mostly will just get up and walk a little    Objective:   Physical Exam  Constitutional: She appears well-developed and well-nourished. No distress.  Neck: Normal range of motion. Neck supple. No thyromegaly present.  Cardiovascular: Normal rate, regular rhythm, normal heart sounds and intact distal pulses.  Exam reveals no gallop.   No murmur heard. Pulmonary/Chest: Effort normal and breath  sounds normal. No respiratory distress. She has no wheezes. She has no rales.  Abdominal: Soft. There is no tenderness.  Musculoskeletal: She exhibits no edema or tenderness.  Lymphadenopathy:    She has no cervical adenopathy.  Neurological:  Normal conversation---slight insight into problems----  "I just have trouble getting the TV onto the programs I want, like sports"  Psychiatric: She has a normal mood and affect. Her behavior is normal.          Assessment & Plan:

## 2014-07-14 DIAGNOSIS — K219 Gastro-esophageal reflux disease without esophagitis: Secondary | ICD-10-CM | POA: Diagnosis not present

## 2014-07-14 DIAGNOSIS — G309 Alzheimer's disease, unspecified: Secondary | ICD-10-CM | POA: Diagnosis not present

## 2014-07-14 DIAGNOSIS — G479 Sleep disorder, unspecified: Secondary | ICD-10-CM | POA: Diagnosis not present

## 2014-07-17 DIAGNOSIS — J189 Pneumonia, unspecified organism: Secondary | ICD-10-CM | POA: Diagnosis not present

## 2014-08-22 NOTE — H&P (Signed)
PATIENT NAME:  Holly Koch, Holly Koch MR#:  [id number] DATE OF BIRTH:  [birth year]  DATE OF ADMISSION:  04/26/2014  REFERRING PHYSICIAN: Maurice Sink. Dolores Frame, MD.  PRIMARY CARE PHYSICIAN: Karie Schwalbe, MD.  ADMITTING DOCTOR: Crissie Figures, MD.  CHIEF COMPLAINT:  1. Generalized weakness.  Cough with expectoration for the past 5 days.Marland Kitchen   HISTORY OF PRESENT ILLNESS: An 81 year old Caucasian female with a past medical history of hypertension, TIA, gastroesophageal reflux disease, history of dementia/impaired memory, who presents to the Emergency Room with complaints of shortness of breath with wheezing associated with cough with expectoration for the past 5 days and generalized weakness. The patient stated that the symptoms started with URI symptoms like a sore throat with shortness of breath and cough, which gradually worsened associated with increasing generalized weakness, hence came to the Emergency Room for further evaluation. In the Emergency Room, the patient was evaluated by the ED physician and was found to be mildly hypoxic with diffuse wheezing bilaterally and was given oxygen supplementation and vigorous DuoNeb nebulizers and IV Solu-Medrol, following which her oxygen saturation improved and it is maintaining in the 95 to 96 range at this time. She does not have a diagnosis of documented COPD, but because of her extensive wheezing with shortness of breath and hypoxia, the ER physician was thinking of possible COPD. The patient is currently receiving oxygen supplementation through nasal cannula and receiving DuoNebs and states she is feeling slightly better. No history of any fever. No chest pain. No nausea. No vomiting. No diarrhea. No dizziness. No loss of consciousness. No urinary symptoms such as frequency, urgency, hematuria. The hospitalist service was consulted by the ED physician for further evaluation and management. Because of hypoxia on presentation and a chest x-ray did not reveal any  pneumonia but peribronchial thickening with possible atelectasis, so a CT angiogram of the chest was ordered to rule out any pulmonary embolism. Blood cultures and sputum cultures were also obtained and the patient was started on IV antibiotics, namely ceftriaxone and azithromycin to cover for acute bronchitis versus possible pneumonia.Marland Kitchen   PAST MEDICAL HISTORY:  1. Hypertension.  2. Gastroesophageal reflux disease.  3. TIA.  4. History of dementia/impaired memory.   PAST SURGICAL HISTORY: 1. Hysterectomy.  2. Hemorrhoidectomy.  3. Open reduction internal fixation of right patella.   ALLERGIES: 1. VIOXX.  2. MELATONIN.   HOME MEDICATIONS: 1. Aricept 10 mg 1 tablet orally once a day.  2. Aspirin 81 mg 1 tablet orally once a day.  3. Methocarbamol 500 mg tablet 1 to 2 tablets orally as needed.  4. Trazodone 50 mg 1 to 2 tablets orally at bedtime as needed for sleep.   SOCIAL HISTORY: She is widowed. A resident of [address] living place. Denies any history of smoking, alcohol or substance abuse.   FAMILY HISTORY: Mother died of complications from heart disease and she had diabetes, and father committed suicide.   REVIEW OF SYSTEMS:   CONSTITUTIONAL: Positive for generalized weakness. Negative for fever.  EYES: Negative for blurred vision, double vision. No pain. No redness. No inflammation.  EARS, NOSE, AND THROAT: Positive for some tickling in the throat with a sore throat. Negative for tinnitus, ear pain, hearing loss. No epistaxis.  RESPIRATORY: . Positive for cough, wheezing, and productive sputum. No painful respirations. No hemoptysis.  CARDIOVASCULAR: Negative for chest pain. No pedal edema. No palpitations. No dizziness. No syncope.  GI: Negative for nausea, vomiting, diarrhea, abdominal pain, hematemesis, melena, or rectal  bleeding.  GENITOURINARY: Negative for dysuria, hematuria, frequency, or urgency.  ENDOCRINE: Negative for polyuria or nocturia. No  heat or cold intolerance.  HEMATOLOGIC: Negative for anemia, easy bruising, bleeding, or swollen glands.  INTEGUMENTARY: Negative for acne, skin rash, lesions.  MUSCULOSKELETAL: Negative for any joint pain, arthritis, or swelling.   NEUROLOGICAL: Negative for focal weakness or numbness. No history of CVA or TIA or seizure disorder. History of memory impairment/dementia for which she takes Aricept.  PSYCHIATRIC: Negative for anxiety, insomnia, or depression.   PHYSICAL EXAMINATION:  VITAL SIGNS: Temperature 98.1 degrees Fahrenheit, pulse rate 58 per minute, respirations 18 per minute, blood pressure 144/62. Oxygen saturation initially on presentation was 100% on room air. Subsequently it was 95% on room air.  GENERAL: Well-nourished, well-developed, elderly lady, alert, awake, and oriented, lying in the bed, not in any acute distress at present.  HEAD: Atraumatic, normocephalic.  EYES: Pupils equal, react to light and accommodation. No conjunctival pallor. No scleral icterus. Extraocular movements intact.  NOSE: No nasal lesions. No drainage.  EARS: No drainage. No external lesions. ORAL CAVITY: No mucosal lesions. No exudates.  NECK: Supple. No JVD. No thyromegaly. No carotid bruit. Range of motion of neck normal. RESPIRATORY: Good respiratory effort. Not using accessory muscles of respiration. Bilateral diffuse rhonchi present. Diminished air entry at bases present.  CARDIOVASCULAR: S1, S2 regular. No murmurs, gallops, or clicks appreciated. Peripheral pulses equal in carotid, femoral, and pedal pulses. No peripheral edema.  GASTROINTESTINAL: Abdomen soft and nontender. No hepatosplenomegaly. Bowel sounds present and equal in all 4 quadrants. No tenderness, rebound or guarding.  GENITOURINARY: Deferred.  MUSCULOSKELETAL: Gait not tested. Range of motion adequate in all areas. Strength and tone equal bilaterally.  SKIN: Inspection within normal limits.  LYMPHATIC: No cervical lymphadenopathy.   VASCULAR: Good dorsalis pedis and posterior tibial pulses.  NEUROLOGICAL: Alert, awake, and oriented x3. Cranial nerves II through XII grossly intact. Deep tendon reflexes 2+ bilaterally and symmetrical. Motor strength is 5/5 in both upper and lower extremities.  PSYCHIATRIC: Judgment and insight are adequate. Alert and oriented x3. Memory and mood within normal limits.   LABORATORY DATA: Serum glucose 131, BUN 14, creatinine wnl, sodium 144, potassium 3.5, chloride 109, bicarbonate 26, total calcium 9. Troponin less than 0.02. WBC 8.2, hemoglobin 13.2, hematocrit 41.4, platelet count 263,000, neutrophil percent 79.1. Urinalysis: Cloudy, nitrite negative, leukocyte esterase negative, WBC 4 per high-power field.   IMAGING STUDIES: Chest x-ray: Finding of COPD, mild left basilar atelectasis. Chronic peribronchial thickening noted.   EKG: Sinus bradycardia with ventricular rate of 56 beats per minute, nonspecific T-wave abnormality, otherwise no acute ST-T changes.   ASSESSMENT AND PLAN: An 81 year old Caucasian female with a past medical history significant for hypertension, gastroesophageal reflux disease, TIA, memory impairment versus dementia, who presents with shortness of breath with wheezing, cough with productive sputum of 5 days duration, found to be hypoxic in the Emergency Department with wheezing. No history of documented COPD in the past.  1. Cough with productive sputum for the past 5 days with associated wheezing secondary to acute bronchitis, rule out pneumonia.  Plan: Admit to medical floor. Blood cultures and sputum cultures ordered. Continue supplementation. Start IV antibiotics with Rocephin and azithromycin for possible pneumonia versus acute bronchitis. Vigorous DuoNebs, IV Solu-Medrol, and monitor oxygen saturations.  2. Acute hypoxic respiratory failure secondary to acute bronchitis. Rule out pulmonary embolism. We will obtain CT angiogram of the chest to evaluate for possible  pulmonary embolism.  Plan: Continue oxygen supplementation and continue  treatment with vigorous nebs, IV Solu-Medrol, IV antibiotics, and monitor oxygen saturations. Obtain CT angiogram of the chest to evaluate for possible PE. 3. Reactive airway disease secondary to acute bronchitis versus pneumonia. Possible chronic obstructive pulmonary disease. Plan: Oxygen supplementation, IV Solu-Medrol, vigorous DuoNebs. Consider further work-up accordingly.  4. Hypertension, controlled on home medications. Continue same.  5. Impaired memory versus dementia, on Aricept. The patient stable. Continue same.  6. Chronic insomnia on p.r.n. trazodone. Continue same.  7. Deep vein thrombosis prophylaxis. Subcutaneous Lovenox.  8. Gastrointestinal prophylaxis with proton pump inhibitor.  9. CODE STATUS: Full code.   TIME SPENT: 55 minutes.    ____________________________ Crissie Figures, MD enr:jh D: 04/26/2014 07:14:59 ET T: 04/26/2014 07:47:43 ET JOB#: [id number]  cc: Crissie Figures, MD, <Dictator> Karie Schwalbe, MD Crissie Figures MD ELECTRONICALLY SIGNED 04/26/2014 20:17

## 2014-08-23 NOTE — H&P (Signed)
PATIENT NAME:  Holly Koch, Holly Y MR#:  [id number] DATE OF BIRTH:  08/13/1926  DATE OF ADMISSION:  07/24/2011  PRIMARY CARE PHYSICIAN: Tillman Abideichard Letvak, MD  CHIEF COMPLAINT: Chest pain.   HISTORY OF PRESENT ILLNESS: This is an 81 year old female who currently resides at [facility] who presents to the hospital secondary to chest pain. She describes the chest pain as being uncomfortable on the left side underneath her breasts, also in the mid sternal area. The patient says that she has had similar symptoms like this in the past a few years ago when her husband had passed away. She had a negative work-up at that time. She returns now with similar symptoms. She does admit to some dizziness with these symptoms, but no shortness of breath, no diaphoresis, no palpitations, no syncope, and no other associated symptoms. She still continues to have a dull ache. It is about a 2 out of 10 in intensity, in her chest. Given the persistence of her symptoms, hospitalist services were contacted for further treatment and evaluation.   REVIEW OF SYSTEMS: CONSTITUTIONAL: No documented fever. No weight gain. No weight loss. EYES: No blurred or double vision. ENT: No tinnitus or postnasal drip. No redness of the oropharynx. RESPIRATORY: No cough, no wheeze, and no hemoptysis. CARDIOVASCULAR: Positive chest pain. No orthopnea, no palpitations, and no syncope. GI: No nausea, no vomiting, no diarrhea, no abdominal pain, and no melena or hematochezia. GU: No dysuria or hematuria. ENDOCRINE: No polyuria or nocturia. No heat or cold intolerance. HEME: No anemia, no bruising, and no bleeding. INTEGUMENTARY: No rashes and no lesions. MUSCULOSKELETAL: No arthritis, swelling, or gout. NEUROLOGIC: No numbness or tingling. No ataxia. No seizure-type activity. PSYCH: No anxiety, no insomnia, and no ADD.   PAST MEDICAL HISTORY:  1. Hypertension.  2. Hyperlipidemia.  3. History of mitral prolapse. 4. Sleep apnea.   5. Raynaud's phenomenon. 6. History of transient ischemic attacks.   ALLERGIES: No known drug allergies.   SOCIAL HISTORY: No smoking. No alcohol abuse. No illicit drug abuse. She is married. She has two children.   FAMILY HISTORY: Mother died from complications of heart disease. She was diabetic. father committed suicide.   CURRENT MEDICATIONS:  1. Omeprazole 20 mg daily.  2. Aspirin 325 mg daily.  3. Calcium with vitamin D 1 tab twice a day. 4. Trazodone 50 mg at bedtime as needed.   PHYSICAL EXAMINATION:   VITAL SIGNS: Temperature 98.1, pulse 60, respirations 18, blood pressure 134/62, and saturations are 100% on room air.   GENERAL: She is a pleasant appearing female in no apparent distress.   HEENT: Atraumatic, normocephalic. Extraocular muscles are intact. Pupils are equal and reactive to light. Sclerae anicteric. No conjunctival injection. No pharyngeal erythema.   NECK: Supple. No jugular venous distention, no bruits, no lymphadenopathy, and no thyromegaly.   HEART: Regular rate and rhythm. No murmurs, no rubs, and no clicks.   LUNGS: Clear to auscultation bilaterally. No rales, no rhonchi, and no wheezes.   ABDOMEN: Soft, flat, nontender, and nondistended. Good bowel sounds. No hepatosplenomegaly is appreciated.   EXTREMITIES: No evidence of any cyanosis, clubbing, or peripheral edema. Has +2 pedal and radial pulses bilaterally.   NEUROLOGIC: The patient is alert, awake, and oriented x3 with no focal motor or sensory deficits appreciated bilaterally.   SKIN: Moist and warm with no rash appreciated.   LYMPHATIC: There is no cervical or axillary lymphadenopathy.  LABS/STUDIES: Serum glucose 93, BUN 27, creatinine 0.5, sodium 145, potassium 3.7,  chloride 107, bicarbonate 27. The patient's liver function tests are within normal limits. Troponin is less than 0.02. CK 46. White cell count 6.3, hemoglobin 12.7, hematocrit 37.7, platelet count 218.   The patient did have  an EKG done which shows normal sinus rhythm with normal axis and no evidence of any acute ST or T wave changes.   The patient did have a chest x-ray done which showed no evidence of any acute cardiopulmonary disease.   ASSESSMENT AND PLAN: This is an 81 year old female with past medical history of hypertension, hyperlipidemia, mitral valve prolapse, history of previous TIA, and osteoporosis who presents to the hospital with chest pain.  1. Chest pain: The patient does have very atypical features for her chest pain, seems likely musculoskeletal in nature, but I will go ahead and observe her overnight on telemetry. We will follow serial cardiac markers, continue aspirin, nitroglycerin, and oxygen. I will get a stress test in the morning.  2. Hypertension: The patient currently is hemodynamically stable. She does not take any medications.  3. History of previous transient ischemic attacks: Continue with aspirin.  4. Osteoporosis: Continue with calcium and vitamin D supplements.  5. Hyperlipidemia: Also currently not taking any medications. I will go ahead and check a lipid profile.            CODE STATUS: FULL CODE.   TIME SPENT: 50 minutes.  ____________________________ Rolly Pancake. Cherlynn Kaiser, MD vjs:slb D: 07/24/2011 08:23:00 ET T: 07/24/2011 08:55:15 ET JOB#: [id number]  cc: Rolly Pancake. Cherlynn Kaiser, MD, <Dictator> Karie Schwalbe, MD Houston Siren MD ELECTRONICALLY SIGNED 07/27/2011 10:46

## 2014-08-23 NOTE — Discharge Summary (Signed)
PATIENT NAME:  Holly Koch, Holly Koch MR#:  [id number] DATE OF BIRTH:  [birth year]  DATE OF ADMISSION:  07/24/2011 DATE OF DISCHARGE:  07/25/2011  DISCHARGE DIAGNOSIS: Chest pain, likely noncardiac, could be muscular with negative serial cardiac enzymes. Negative Myoview.   SECONDARY DIAGNOSES:  1. Hypertension.  2. Hyperlipidemia. 3. History of mitral valve prolapse. 4. Sleep apnea. 5. Raynaud's phenomenon.  6. Transient ischemic attack.   CONSULTATION: Physical therapy.   PROCEDURES/RADIOLOGY:  1. Chest x-ray on the 25th of March remained negative, showed no acute cardiopulmonary disease, only chronic obstructive pulmonary disease present.  2. Myoview on the 26th of March was negative.   HISTORY AND SHORT HOSPITAL COURSE: The patient is an 81 year old female with the above-mentioned medical problems was admitted for chest pain. Please see Dr. Hilbert OdorSainani's dictated history and physical. She was ruled out with four negative sets of cardiac enzymes. She also underwent Myoview which was negative. On mentioning plan of discharge, she was hesitant and felt that she will need to stay, that she is feeling unsteady on the gait and feels wobbly and would like to stay overnight, although there was no obvious etiology for same. She was evaluated by physical therapy due to this concern and was recommended discharge home only. There was no skilled therapy need found. She was discharged home in stable condition.   PERTINENT PHYSICAL EXAMINATION: VITAL SIGNS: On the date of discharge, her vital signs are as follows: Temperature 98.1, heart rate 65 per minute, respirations 20 per minute, blood pressure 131/73. She was saturating 100% on room air. CARDIOVASCULAR: S1, S2 normal. No murmur, rubs, or gallop. LUNGS: Clear to auscultation bilaterally. No wheezes, rales, rhonchi, or crepitation. ABDOMEN: Soft, benign. NEUROLOGIC: Nonfocal examination. All other physical examination remained at baseline.   DISCHARGE  MEDICATIONS:  1. Aricept 10 mg p.o. at bedtime.  2. Trazodone 50 mg 1 to 2 tablets p.o. at bedtime as needed.  3. Temazepam 7.5 mg 1 to 2 tablets p.o. at bedtime as needed.  4. Methocarbamol 500 mg 1 to 2 tablets p.o. at bedtime as needed.   DISCHARGE DIET: Low sodium.   DISCHARGE ACTIVITY: As tolerated.   DISCHARGE INSTRUCTIONS AND FOLLOW-UP: The patient was instructed to follow up with her primary care physician, Dr. Tillman Abideichard Letvak, in 1 to 2 weeks.   TOTAL TIME DISCHARGING THIS PATIENT: 45 minutes.    ____________________________ Ellamae SiaVipul S. Sherryll BurgerShah, MD vss:ap D: 07/27/2011 13:28:10 ET T: 07/27/2011 16:44:32 ET JOB#: [id number]  cc: Kezia Benevides S. Sherryll BurgerShah, MD, <Dictator> Karie Schwalbeichard I. Letvak, MD  Ellamae SiaVIPUL S [facility] MD ELECTRONICALLY SIGNED 07/29/2011 9:38

## 2014-08-30 NOTE — Discharge Summary (Signed)
PATIENT NAME:  ELLASON, SEGAR MR#:  [id number] DATE OF BIRTH:  [birth year]  DATE OF ADMISSION:  04/26/2014 DATE OF DISCHARGE:  04/28/2014  ADMITTING DIAGNOSES: Cough with productive sputum likely due to acute bronchitis.   DISCHARGE DIAGNOSES:  1.  Chronic obstructive pulmonary disease exacerbation.  2.  Acute bilateral pneumonia due to unknown infectious agent at this time with dry cough. 3.  Hyperglycemia, no diabetes mellitus.   4.  Dementia.  5.  Generalized weakness.   DISCHARGE CONDITION: Stable.   DISCHARGE MEDICATIONS:  1. The patient is to continue Aricept 10 mg p.o. at bedtime.  2. Trazodone 50 mg 1-2 tablets at bedtime as needed.  3. Methocarbamol 500 mg 1-2 tablets at bedtime as needed.  4. Aspirin 81 mg p.o. daily.  5. Prednisone taper at 50 mg p.o. once on 04/29/2014, then taper by 10 mg daily until stopped.  6. Symbicort 160/4.5 two puffs twice daily.  7. Tiotropium 1 inhalation once daily.  8. Combivent Respimat 1 puff 4 times daily as needed.  9. Guaifenesin 600 mg p.o. twice daily.  10. Tussionex 5 mL twice daily.  11. Levaquin 500 mg p.o. every 24 hours to complete course.  12. Codeine and guaifenesin oral liquid 10 mg/200 mg in 5 mL oral solution, 10 mL every 4 hours as needed.   DISPOSITION: The patient is being discharged with home health physical therapy as well as nurse.   HOME OXYGEN: None.   DIET: Regular,  mechanical soft consistency.   ACTIVITY LIMITATIONS: As tolerated.   REFERRAL: To home health physical therapy as well as [facility].   FOLLOWUP APPOINTMENT:  With Dr. Alphonsus Sias in 2 days after discharge. The patient was advised to have CT scan of the chest repeated in the next 6 weeks after discharge.   CONSULTANTS: Care management, social work.   RADIOLOGIC STUDIES:  Chest x-ray PA and lateral 04/26/2014 showed the findings of COPD, mild left basilar atelectasis was noted, chronic peribronchial thickening was also noted according to the radiologist. CT  scan of the chest with IV contrast to rule out pulmonary embolism 04/26/2014 revealed patchy nodular ground glass opacities over the upper lobes and right middle lobe as described with the largest over the right upper lobe measuring 2.8 cm, differential diagnosis included infection, inflammatory, or neoplastic diseases. There are a few other subcentimeter solid and sub-solid nodules as described, recommend followup CT scan in 6 weeks, bibasilar scarring versus atelectasis with minimal bronchiectatic change, no evidence of pulmonary embolism, atherosclerotic coronary artery disease, mild stable anterior wedging of upper thoracic vertebral body.   HOSPITAL COURSE:  The patient is an 81 year old female with past medical history significant for history of hypertension, gastroesophageal reflux disease, history of dementia, TIAs, who presents to the hospital with complaints of generalized weakness as well as cough and expectorations for the past 5 days. Please refer to Dr. Jae Dire admission note on 04/26/2014. On arrival to the hospital the patient was afebrile with temperature of 98.1, pulse was 58, respiration rate was 18, blood pressure 144/62, O2 saturation were at 100% on room air. Physical exam revealed diminished air entry at bases as well as bilateral diffuse rhonchi, but no acute abnormalities otherwise were found. The patient's laboratory data done on arrival to the hospital showed mild elevation of glucose to 131, otherwise BMP was unremarkable. Hemoglobin A1c was 5.1. Troponin was less than 0.02. CBC within normal limits with white blood cell count 8.2, hemoglobin 13.2, platelet count 263,000. Absolute neutrophil count was 6.5.  Blood culture taken on 04/26/2014 showed no growth. Urine culture revealed mixed bacterial organisms, suggestion conmtamination. Sputum culture showed normal flora in 48 hours. The patient's urinalysis was remarkable for 4 white blood cells, 1 red blood cell, negative for nitrites or  leukocyte esterase. EKG showed sinus bradycardia at 56 beats per minute, nonspecific ST-T wave abnormality. Chest x-ray was remarkable for atelectasis versus pneumonia. The patient was admitted to the hospital for further evaluation. She was started on broad-spectrum antibiotic therapy and continued. She improved and was felt to be stable to be discharged home on 04/28/2014.  She still was having some dry cough, but no sputum production, and her O2 saturations remained stable. She improved somewhat with cough medications.   Vital signs were stable and on the day of discharge, 04/28/2014 the patient's temperature was 97.8, pulse was 61-73, respiration rate was 18-20, blood pressure ranging from 158 to 171 systolic and 80s diastolic, O2 saturations were 69-62%95-96% on room air at rest. It was felt that the patient would benefit from antibiotic therapy to complete course as well as steroid taper and inhalation therapy. She is to follow up with her primary care physician, Dr. Alphonsus SiasLetvak, for further recommendations in regards to management of her cough which bothers her mostly.   TIME SPENT: 40 minutes.     ____________________________ Katharina Caperima Madisson Kulaga, MD rv:bu D: 05/04/2014 15:22:20 ET T: 05/04/2014 16:17:28 ET JOB#: [id number]  cc: Katharina Caperima Kaydense Rizo, MD, <Dictator> Karie Schwalbeichard I. Letvak, MD Alson Mcpheeters MD ELECTRONICALLY SIGNED 05/14/2014 9:45

## 2014-08-31 DIAGNOSIS — I10 Essential (primary) hypertension: Secondary | ICD-10-CM | POA: Diagnosis not present

## 2014-09-02 DIAGNOSIS — G479 Sleep disorder, unspecified: Secondary | ICD-10-CM | POA: Diagnosis not present

## 2014-09-02 DIAGNOSIS — K219 Gastro-esophageal reflux disease without esophagitis: Secondary | ICD-10-CM | POA: Diagnosis not present

## 2014-09-02 DIAGNOSIS — I10 Essential (primary) hypertension: Secondary | ICD-10-CM | POA: Diagnosis not present

## 2014-09-02 DIAGNOSIS — G309 Alzheimer's disease, unspecified: Secondary | ICD-10-CM | POA: Diagnosis not present

## 2014-10-30 DIAGNOSIS — G309 Alzheimer's disease, unspecified: Secondary | ICD-10-CM

## 2014-10-30 DIAGNOSIS — G479 Sleep disorder, unspecified: Secondary | ICD-10-CM

## 2014-10-30 DIAGNOSIS — K219 Gastro-esophageal reflux disease without esophagitis: Secondary | ICD-10-CM

## 2014-10-30 DIAGNOSIS — I10 Essential (primary) hypertension: Secondary | ICD-10-CM

## 2015-01-01 DIAGNOSIS — G309 Alzheimer's disease, unspecified: Secondary | ICD-10-CM | POA: Diagnosis not present

## 2015-01-01 DIAGNOSIS — K219 Gastro-esophageal reflux disease without esophagitis: Secondary | ICD-10-CM | POA: Diagnosis not present

## 2015-01-01 DIAGNOSIS — G479 Sleep disorder, unspecified: Secondary | ICD-10-CM | POA: Diagnosis not present

## 2015-01-01 DIAGNOSIS — I10 Essential (primary) hypertension: Secondary | ICD-10-CM | POA: Diagnosis not present

## 2015-03-10 DIAGNOSIS — I10 Essential (primary) hypertension: Secondary | ICD-10-CM | POA: Diagnosis not present

## 2015-03-10 DIAGNOSIS — K219 Gastro-esophageal reflux disease without esophagitis: Secondary | ICD-10-CM | POA: Diagnosis not present

## 2015-03-10 DIAGNOSIS — G479 Sleep disorder, unspecified: Secondary | ICD-10-CM | POA: Diagnosis not present

## 2015-03-10 DIAGNOSIS — G309 Alzheimer's disease, unspecified: Secondary | ICD-10-CM | POA: Diagnosis not present

## 2015-04-06 ENCOUNTER — Telehealth: Payer: Self-pay

## 2015-04-06 NOTE — Telephone Encounter (Signed)
Melissa Cross pts daughter left v/m requesting cb about pts status;(do not see DPR);Melissa visited pt last weekend in [facility]; pt has gained quite a bit of weight, Melissa wonders if med is causing pt to gain weight and Melissa understands pt's activity level is very low. Melissa wants to know what Dr Alphonsus SiasLetvak thinks about pts present mental state. Melissa request cb.

## 2015-04-07 NOTE — Telephone Encounter (Signed)
Discussed her status Mild weight gain that is favorable---will hold supplements for now. Overall, still very satisfied and doing okay despite slow cognitive decline

## 2015-05-14 DIAGNOSIS — G309 Alzheimer's disease, unspecified: Secondary | ICD-10-CM | POA: Diagnosis not present

## 2015-05-14 DIAGNOSIS — G479 Sleep disorder, unspecified: Secondary | ICD-10-CM | POA: Diagnosis not present

## 2015-05-14 DIAGNOSIS — I10 Essential (primary) hypertension: Secondary | ICD-10-CM | POA: Diagnosis not present

## 2015-05-14 DIAGNOSIS — K219 Gastro-esophageal reflux disease without esophagitis: Secondary | ICD-10-CM | POA: Diagnosis not present

## 2015-05-19 DIAGNOSIS — J069 Acute upper respiratory infection, unspecified: Secondary | ICD-10-CM | POA: Diagnosis not present

## 2015-07-02 DIAGNOSIS — G309 Alzheimer's disease, unspecified: Secondary | ICD-10-CM | POA: Diagnosis not present

## 2015-07-02 DIAGNOSIS — G479 Sleep disorder, unspecified: Secondary | ICD-10-CM | POA: Diagnosis not present

## 2015-07-02 DIAGNOSIS — I10 Essential (primary) hypertension: Secondary | ICD-10-CM | POA: Diagnosis not present

## 2015-07-02 DIAGNOSIS — K219 Gastro-esophageal reflux disease without esophagitis: Secondary | ICD-10-CM | POA: Diagnosis not present

## 2015-08-30 DIAGNOSIS — I10 Essential (primary) hypertension: Secondary | ICD-10-CM | POA: Diagnosis not present

## 2015-09-01 DIAGNOSIS — E039 Hypothyroidism, unspecified: Secondary | ICD-10-CM | POA: Diagnosis not present

## 2015-09-01 DIAGNOSIS — F39 Unspecified mood [affective] disorder: Secondary | ICD-10-CM

## 2015-09-01 DIAGNOSIS — I10 Essential (primary) hypertension: Secondary | ICD-10-CM | POA: Diagnosis not present

## 2015-09-01 DIAGNOSIS — K219 Gastro-esophageal reflux disease without esophagitis: Secondary | ICD-10-CM | POA: Diagnosis not present

## 2015-09-01 DIAGNOSIS — M1990 Unspecified osteoarthritis, unspecified site: Secondary | ICD-10-CM | POA: Diagnosis not present

## 2015-09-01 DIAGNOSIS — M6281 Muscle weakness (generalized): Secondary | ICD-10-CM

## 2015-09-15 DIAGNOSIS — I10 Essential (primary) hypertension: Secondary | ICD-10-CM | POA: Diagnosis not present

## 2015-09-15 DIAGNOSIS — H8149 Vertigo of central origin, unspecified ear: Secondary | ICD-10-CM | POA: Diagnosis not present

## 2015-09-15 DIAGNOSIS — I499 Cardiac arrhythmia, unspecified: Secondary | ICD-10-CM | POA: Diagnosis not present

## 2015-09-17 DIAGNOSIS — R42 Dizziness and giddiness: Secondary | ICD-10-CM | POA: Diagnosis not present

## 2015-09-17 DIAGNOSIS — R4182 Altered mental status, unspecified: Secondary | ICD-10-CM | POA: Diagnosis not present

## 2015-09-17 DIAGNOSIS — R6889 Other general symptoms and signs: Secondary | ICD-10-CM | POA: Diagnosis not present

## 2015-09-20 DIAGNOSIS — R42 Dizziness and giddiness: Secondary | ICD-10-CM | POA: Diagnosis not present

## 2015-10-13 DIAGNOSIS — K648 Other hemorrhoids: Secondary | ICD-10-CM | POA: Diagnosis not present

## 2015-11-12 DIAGNOSIS — G479 Sleep disorder, unspecified: Secondary | ICD-10-CM | POA: Diagnosis not present

## 2015-11-12 DIAGNOSIS — G309 Alzheimer's disease, unspecified: Secondary | ICD-10-CM | POA: Diagnosis not present

## 2015-11-12 DIAGNOSIS — H8010 Otosclerosis involving oval window, obliterative, unspecified ear: Secondary | ICD-10-CM

## 2015-11-12 DIAGNOSIS — I10 Essential (primary) hypertension: Secondary | ICD-10-CM | POA: Diagnosis not present

## 2015-11-12 DIAGNOSIS — K219 Gastro-esophageal reflux disease without esophagitis: Secondary | ICD-10-CM | POA: Diagnosis not present

## 2015-12-31 DIAGNOSIS — I10 Essential (primary) hypertension: Secondary | ICD-10-CM | POA: Diagnosis not present

## 2015-12-31 DIAGNOSIS — G309 Alzheimer's disease, unspecified: Secondary | ICD-10-CM | POA: Diagnosis not present

## 2015-12-31 DIAGNOSIS — K219 Gastro-esophageal reflux disease without esophagitis: Secondary | ICD-10-CM | POA: Diagnosis not present

## 2015-12-31 DIAGNOSIS — G479 Sleep disorder, unspecified: Secondary | ICD-10-CM | POA: Diagnosis not present

## 2016-03-01 DIAGNOSIS — G479 Sleep disorder, unspecified: Secondary | ICD-10-CM | POA: Diagnosis not present

## 2016-03-01 DIAGNOSIS — K219 Gastro-esophageal reflux disease without esophagitis: Secondary | ICD-10-CM | POA: Diagnosis not present

## 2016-03-01 DIAGNOSIS — I10 Essential (primary) hypertension: Secondary | ICD-10-CM | POA: Diagnosis not present

## 2016-03-01 DIAGNOSIS — G309 Alzheimer's disease, unspecified: Secondary | ICD-10-CM | POA: Diagnosis not present

## 2016-05-03 DIAGNOSIS — I10 Essential (primary) hypertension: Secondary | ICD-10-CM | POA: Diagnosis not present

## 2016-05-03 DIAGNOSIS — G479 Sleep disorder, unspecified: Secondary | ICD-10-CM | POA: Diagnosis not present

## 2016-05-03 DIAGNOSIS — G309 Alzheimer's disease, unspecified: Secondary | ICD-10-CM | POA: Diagnosis not present

## 2016-05-03 DIAGNOSIS — K219 Gastro-esophageal reflux disease without esophagitis: Secondary | ICD-10-CM | POA: Diagnosis not present

## 2016-05-08 DIAGNOSIS — J069 Acute upper respiratory infection, unspecified: Secondary | ICD-10-CM | POA: Diagnosis not present

## 2016-05-08 DIAGNOSIS — R0982 Postnasal drip: Secondary | ICD-10-CM | POA: Diagnosis not present

## 2016-05-13 ENCOUNTER — Telehealth: Payer: Self-pay | Admitting: Family Medicine

## 2016-05-13 NOTE — Telephone Encounter (Signed)
Pt accidentally rec wrong med today incl B12, amlodipine, plavix , lisinopril and gabapentin  Vitals are unchanged, MS is unchanged  Doing neuro checks and vitals every hour -family is aware   Need to watch closely for hypotension  If bp less than 90-100/50-60- order to send to ED and update   They will continue to update us as well   Will cc to PCP

## 2016-05-15 NOTE — Telephone Encounter (Signed)
Noted Reviewed all with nurse there this AM and no problems for her

## 2016-05-19 NOTE — Telephone Encounter (Signed)
PLEASE NOTE: All timestamps contained within this report are represented as [place] Standard Time. CONFIDENTIALTY NOTICE: This fax transmission is intended only for the addressee. It contains information that is legally privileged, confidential or otherwise protected from use or disclosure. If you are not the intended recipient, you are strictly prohibited from reviewing, disclosing, copying using or disseminating any of this information or taking any action in reliance on or regarding this information. If you have received this fax in error, please notify us immediately by telephone so that we can arrange for its return to us. Phone: [phone], Toll-Free: [phone], Fax: [phone] Page: 1 of 1 Call Id: [id number] [facility] - Client Nonclinical Telephone Record [facility] Client [facility] - [facility] Physician Tillman AbideLetvak, Richard - MD Contact Type Call Who Is Calling Physician / Provider / Hospital Call Type [facility] Reason for Call Request to speak to Physician Initial Comment Suezanne CheshireCaller, Teresa w[facility] @ [phone] and just gave pt the wrong medication and is needing to speak to the On Call. , Additional Comment Patient Name Holly Koch Patient DOB 03-Oct-1926 [facility]ing Provider [facility] Number [phone] [facility] Paging DoctorName Phone DateTime Result/Outcome Message Type Notes Roxy Mannsower, [place] [phone] 05/13/2016 9:58:57 AM Paged On Call to Other Provider Doctor Paged [facility]: Please call Rosey Batheresa with [facility] at [phone]. Thank you. Roxy Mannsower, [place] - MD 05/13/2016 9:59:08 AM Paged On Call to Another Provider Message Result Call Closed By: Gasper Sellsavid Partin Transaction Date/Time: 05/13/2016 9:43:31 AM (ET)

## 2016-05-19 NOTE — Telephone Encounter (Signed)
Per this note this has been addressed already.

## 2016-06-30 DIAGNOSIS — G479 Sleep disorder, unspecified: Secondary | ICD-10-CM | POA: Diagnosis not present

## 2016-06-30 DIAGNOSIS — K219 Gastro-esophageal reflux disease without esophagitis: Secondary | ICD-10-CM | POA: Diagnosis not present

## 2016-06-30 DIAGNOSIS — I10 Essential (primary) hypertension: Secondary | ICD-10-CM | POA: Diagnosis not present

## 2016-06-30 DIAGNOSIS — G309 Alzheimer's disease, unspecified: Secondary | ICD-10-CM | POA: Diagnosis not present

## 2016-08-30 DIAGNOSIS — H8149 Vertigo of central origin, unspecified ear: Secondary | ICD-10-CM | POA: Diagnosis not present

## 2016-08-30 DIAGNOSIS — K219 Gastro-esophageal reflux disease without esophagitis: Secondary | ICD-10-CM | POA: Diagnosis not present

## 2016-08-30 DIAGNOSIS — G479 Sleep disorder, unspecified: Secondary | ICD-10-CM | POA: Diagnosis not present

## 2016-08-30 DIAGNOSIS — I10 Essential (primary) hypertension: Secondary | ICD-10-CM | POA: Diagnosis not present

## 2016-08-30 DIAGNOSIS — G309 Alzheimer's disease, unspecified: Secondary | ICD-10-CM | POA: Diagnosis not present

## 2016-08-31 DIAGNOSIS — I10 Essential (primary) hypertension: Secondary | ICD-10-CM | POA: Diagnosis not present

## 2016-11-08 DIAGNOSIS — G479 Sleep disorder, unspecified: Secondary | ICD-10-CM | POA: Diagnosis not present

## 2016-11-08 DIAGNOSIS — G309 Alzheimer's disease, unspecified: Secondary | ICD-10-CM | POA: Diagnosis not present

## 2016-11-08 DIAGNOSIS — I10 Essential (primary) hypertension: Secondary | ICD-10-CM | POA: Diagnosis not present

## 2016-11-08 DIAGNOSIS — K219 Gastro-esophageal reflux disease without esophagitis: Secondary | ICD-10-CM | POA: Diagnosis not present

## 2017-01-03 DIAGNOSIS — F39 Unspecified mood [affective] disorder: Secondary | ICD-10-CM | POA: Diagnosis not present

## 2017-01-03 DIAGNOSIS — G479 Sleep disorder, unspecified: Secondary | ICD-10-CM | POA: Diagnosis not present

## 2017-01-03 DIAGNOSIS — G309 Alzheimer's disease, unspecified: Secondary | ICD-10-CM | POA: Diagnosis not present

## 2017-01-03 DIAGNOSIS — I10 Essential (primary) hypertension: Secondary | ICD-10-CM | POA: Diagnosis not present

## 2017-02-19 DIAGNOSIS — J069 Acute upper respiratory infection, unspecified: Secondary | ICD-10-CM | POA: Diagnosis not present

## 2017-03-02 DIAGNOSIS — I10 Essential (primary) hypertension: Secondary | ICD-10-CM | POA: Diagnosis not present

## 2017-03-02 DIAGNOSIS — G309 Alzheimer's disease, unspecified: Secondary | ICD-10-CM | POA: Diagnosis not present

## 2017-03-02 DIAGNOSIS — F39 Unspecified mood [affective] disorder: Secondary | ICD-10-CM | POA: Diagnosis not present

## 2017-03-02 DIAGNOSIS — K219 Gastro-esophageal reflux disease without esophagitis: Secondary | ICD-10-CM | POA: Diagnosis not present

## 2017-05-02 DIAGNOSIS — G309 Alzheimer's disease, unspecified: Secondary | ICD-10-CM | POA: Diagnosis not present

## 2017-05-02 DIAGNOSIS — F39 Unspecified mood [affective] disorder: Secondary | ICD-10-CM | POA: Diagnosis not present

## 2017-05-02 DIAGNOSIS — I10 Essential (primary) hypertension: Secondary | ICD-10-CM | POA: Diagnosis not present

## 2017-05-02 DIAGNOSIS — K219 Gastro-esophageal reflux disease without esophagitis: Secondary | ICD-10-CM | POA: Diagnosis not present

## 2017-06-13 DIAGNOSIS — R001 Bradycardia, unspecified: Secondary | ICD-10-CM | POA: Diagnosis not present

## 2017-06-13 DIAGNOSIS — I69351 Hemiplegia and hemiparesis following cerebral infarction affecting right dominant side: Secondary | ICD-10-CM | POA: Diagnosis not present

## 2017-09-06 DIAGNOSIS — I10 Essential (primary) hypertension: Secondary | ICD-10-CM | POA: Diagnosis not present

## 2017-09-12 DIAGNOSIS — K219 Gastro-esophageal reflux disease without esophagitis: Secondary | ICD-10-CM | POA: Diagnosis not present

## 2017-09-12 DIAGNOSIS — F39 Unspecified mood [affective] disorder: Secondary | ICD-10-CM | POA: Diagnosis not present

## 2017-09-12 DIAGNOSIS — G309 Alzheimer's disease, unspecified: Secondary | ICD-10-CM | POA: Diagnosis not present

## 2017-09-12 DIAGNOSIS — I10 Essential (primary) hypertension: Secondary | ICD-10-CM | POA: Diagnosis not present

## 2017-11-07 DIAGNOSIS — G309 Alzheimer's disease, unspecified: Secondary | ICD-10-CM | POA: Diagnosis not present

## 2017-11-07 DIAGNOSIS — I10 Essential (primary) hypertension: Secondary | ICD-10-CM | POA: Diagnosis not present

## 2017-11-07 DIAGNOSIS — F39 Unspecified mood [affective] disorder: Secondary | ICD-10-CM | POA: Diagnosis not present

## 2017-11-07 DIAGNOSIS — K219 Gastro-esophageal reflux disease without esophagitis: Secondary | ICD-10-CM | POA: Diagnosis not present

## 2018-01-09 DIAGNOSIS — K219 Gastro-esophageal reflux disease without esophagitis: Secondary | ICD-10-CM | POA: Diagnosis not present

## 2018-01-09 DIAGNOSIS — I10 Essential (primary) hypertension: Secondary | ICD-10-CM | POA: Diagnosis not present

## 2018-01-09 DIAGNOSIS — F39 Unspecified mood [affective] disorder: Secondary | ICD-10-CM | POA: Diagnosis not present

## 2018-01-09 DIAGNOSIS — G309 Alzheimer's disease, unspecified: Secondary | ICD-10-CM | POA: Diagnosis not present

## 2018-03-06 DIAGNOSIS — K219 Gastro-esophageal reflux disease without esophagitis: Secondary | ICD-10-CM | POA: Diagnosis not present

## 2018-03-06 DIAGNOSIS — F39 Unspecified mood [affective] disorder: Secondary | ICD-10-CM | POA: Diagnosis not present

## 2018-03-06 DIAGNOSIS — I10 Essential (primary) hypertension: Secondary | ICD-10-CM | POA: Diagnosis not present

## 2018-03-06 DIAGNOSIS — G309 Alzheimer's disease, unspecified: Secondary | ICD-10-CM | POA: Diagnosis not present

## 2018-03-12 DIAGNOSIS — M5136 Other intervertebral disc degeneration, lumbar region: Secondary | ICD-10-CM | POA: Diagnosis not present

## 2018-03-12 DIAGNOSIS — R278 Other lack of coordination: Secondary | ICD-10-CM | POA: Diagnosis not present

## 2018-03-12 DIAGNOSIS — R2689 Other abnormalities of gait and mobility: Secondary | ICD-10-CM | POA: Diagnosis not present

## 2018-03-12 DIAGNOSIS — M6281 Muscle weakness (generalized): Secondary | ICD-10-CM | POA: Diagnosis not present

## 2018-03-13 DIAGNOSIS — R2689 Other abnormalities of gait and mobility: Secondary | ICD-10-CM | POA: Diagnosis not present

## 2018-03-13 DIAGNOSIS — M5136 Other intervertebral disc degeneration, lumbar region: Secondary | ICD-10-CM | POA: Diagnosis not present

## 2018-03-13 DIAGNOSIS — M6281 Muscle weakness (generalized): Secondary | ICD-10-CM | POA: Diagnosis not present

## 2018-03-13 DIAGNOSIS — R278 Other lack of coordination: Secondary | ICD-10-CM | POA: Diagnosis not present

## 2018-03-15 DIAGNOSIS — R278 Other lack of coordination: Secondary | ICD-10-CM | POA: Diagnosis not present

## 2018-03-15 DIAGNOSIS — M5136 Other intervertebral disc degeneration, lumbar region: Secondary | ICD-10-CM | POA: Diagnosis not present

## 2018-03-15 DIAGNOSIS — R2689 Other abnormalities of gait and mobility: Secondary | ICD-10-CM | POA: Diagnosis not present

## 2018-03-15 DIAGNOSIS — M6281 Muscle weakness (generalized): Secondary | ICD-10-CM | POA: Diagnosis not present

## 2018-03-18 DIAGNOSIS — M6281 Muscle weakness (generalized): Secondary | ICD-10-CM | POA: Diagnosis not present

## 2018-03-18 DIAGNOSIS — M5136 Other intervertebral disc degeneration, lumbar region: Secondary | ICD-10-CM | POA: Diagnosis not present

## 2018-03-18 DIAGNOSIS — R278 Other lack of coordination: Secondary | ICD-10-CM | POA: Diagnosis not present

## 2018-03-18 DIAGNOSIS — R2689 Other abnormalities of gait and mobility: Secondary | ICD-10-CM | POA: Diagnosis not present

## 2018-03-19 DIAGNOSIS — R278 Other lack of coordination: Secondary | ICD-10-CM | POA: Diagnosis not present

## 2018-03-19 DIAGNOSIS — M5136 Other intervertebral disc degeneration, lumbar region: Secondary | ICD-10-CM | POA: Diagnosis not present

## 2018-03-19 DIAGNOSIS — R2689 Other abnormalities of gait and mobility: Secondary | ICD-10-CM | POA: Diagnosis not present

## 2018-03-19 DIAGNOSIS — M6281 Muscle weakness (generalized): Secondary | ICD-10-CM | POA: Diagnosis not present

## 2018-03-20 DIAGNOSIS — M6281 Muscle weakness (generalized): Secondary | ICD-10-CM | POA: Diagnosis not present

## 2018-03-20 DIAGNOSIS — R278 Other lack of coordination: Secondary | ICD-10-CM | POA: Diagnosis not present

## 2018-03-20 DIAGNOSIS — M5136 Other intervertebral disc degeneration, lumbar region: Secondary | ICD-10-CM | POA: Diagnosis not present

## 2018-03-20 DIAGNOSIS — R2689 Other abnormalities of gait and mobility: Secondary | ICD-10-CM | POA: Diagnosis not present

## 2018-03-22 DIAGNOSIS — R278 Other lack of coordination: Secondary | ICD-10-CM | POA: Diagnosis not present

## 2018-03-22 DIAGNOSIS — M6281 Muscle weakness (generalized): Secondary | ICD-10-CM | POA: Diagnosis not present

## 2018-03-22 DIAGNOSIS — R2689 Other abnormalities of gait and mobility: Secondary | ICD-10-CM | POA: Diagnosis not present

## 2018-03-22 DIAGNOSIS — M5136 Other intervertebral disc degeneration, lumbar region: Secondary | ICD-10-CM | POA: Diagnosis not present

## 2018-05-15 DIAGNOSIS — I10 Essential (primary) hypertension: Secondary | ICD-10-CM | POA: Diagnosis not present

## 2018-05-15 DIAGNOSIS — K219 Gastro-esophageal reflux disease without esophagitis: Secondary | ICD-10-CM | POA: Diagnosis not present

## 2018-05-15 DIAGNOSIS — G309 Alzheimer's disease, unspecified: Secondary | ICD-10-CM | POA: Diagnosis not present

## 2018-05-15 DIAGNOSIS — F39 Unspecified mood [affective] disorder: Secondary | ICD-10-CM | POA: Diagnosis not present

## 2018-05-22 DIAGNOSIS — I10 Essential (primary) hypertension: Secondary | ICD-10-CM | POA: Diagnosis not present

## 2018-05-22 DIAGNOSIS — R2681 Unsteadiness on feet: Secondary | ICD-10-CM | POA: Diagnosis not present

## 2018-05-23 DIAGNOSIS — I10 Essential (primary) hypertension: Secondary | ICD-10-CM | POA: Diagnosis not present

## 2018-05-23 DIAGNOSIS — R2681 Unsteadiness on feet: Secondary | ICD-10-CM | POA: Diagnosis not present

## 2018-05-27 DIAGNOSIS — R2681 Unsteadiness on feet: Secondary | ICD-10-CM | POA: Diagnosis not present

## 2018-05-27 DIAGNOSIS — I10 Essential (primary) hypertension: Secondary | ICD-10-CM | POA: Diagnosis not present

## 2018-05-28 DIAGNOSIS — R2681 Unsteadiness on feet: Secondary | ICD-10-CM | POA: Diagnosis not present

## 2018-05-28 DIAGNOSIS — I10 Essential (primary) hypertension: Secondary | ICD-10-CM | POA: Diagnosis not present

## 2018-05-29 DIAGNOSIS — R42 Dizziness and giddiness: Secondary | ICD-10-CM | POA: Diagnosis not present

## 2018-05-29 DIAGNOSIS — R2681 Unsteadiness on feet: Secondary | ICD-10-CM | POA: Diagnosis not present

## 2018-05-29 DIAGNOSIS — I10 Essential (primary) hypertension: Secondary | ICD-10-CM | POA: Diagnosis not present

## 2018-05-29 DIAGNOSIS — G250 Essential tremor: Secondary | ICD-10-CM | POA: Diagnosis not present

## 2018-05-29 DIAGNOSIS — R4182 Altered mental status, unspecified: Secondary | ICD-10-CM | POA: Diagnosis not present

## 2018-05-29 DIAGNOSIS — R142 Eructation: Secondary | ICD-10-CM | POA: Diagnosis not present

## 2018-05-29 DIAGNOSIS — R410 Disorientation, unspecified: Secondary | ICD-10-CM | POA: Diagnosis not present

## 2018-05-30 DIAGNOSIS — I10 Essential (primary) hypertension: Secondary | ICD-10-CM | POA: Diagnosis not present

## 2018-05-30 DIAGNOSIS — R2681 Unsteadiness on feet: Secondary | ICD-10-CM | POA: Diagnosis not present

## 2018-05-30 DIAGNOSIS — R4182 Altered mental status, unspecified: Secondary | ICD-10-CM | POA: Diagnosis not present

## 2018-06-04 DIAGNOSIS — R2681 Unsteadiness on feet: Secondary | ICD-10-CM | POA: Diagnosis not present

## 2018-06-04 DIAGNOSIS — I10 Essential (primary) hypertension: Secondary | ICD-10-CM | POA: Diagnosis not present

## 2018-07-17 DIAGNOSIS — F39 Unspecified mood [affective] disorder: Secondary | ICD-10-CM | POA: Diagnosis not present

## 2018-07-17 DIAGNOSIS — G309 Alzheimer's disease, unspecified: Secondary | ICD-10-CM | POA: Diagnosis not present

## 2018-07-17 DIAGNOSIS — K219 Gastro-esophageal reflux disease without esophagitis: Secondary | ICD-10-CM | POA: Diagnosis not present

## 2018-07-17 DIAGNOSIS — I10 Essential (primary) hypertension: Secondary | ICD-10-CM | POA: Diagnosis not present

## 2018-09-02 DIAGNOSIS — I10 Essential (primary) hypertension: Secondary | ICD-10-CM | POA: Diagnosis not present

## 2018-09-11 DIAGNOSIS — K219 Gastro-esophageal reflux disease without esophagitis: Secondary | ICD-10-CM | POA: Diagnosis not present

## 2018-09-11 DIAGNOSIS — G309 Alzheimer's disease, unspecified: Secondary | ICD-10-CM | POA: Diagnosis not present

## 2018-09-11 DIAGNOSIS — I10 Essential (primary) hypertension: Secondary | ICD-10-CM | POA: Diagnosis not present

## 2018-09-11 DIAGNOSIS — F39 Unspecified mood [affective] disorder: Secondary | ICD-10-CM | POA: Diagnosis not present

## 2018-10-02 DIAGNOSIS — L600 Ingrowing nail: Secondary | ICD-10-CM | POA: Diagnosis not present

## 2018-10-09 ENCOUNTER — Ambulatory Visit (INDEPENDENT_AMBULATORY_CARE_PROVIDER_SITE_OTHER): Payer: Medicare Other | Admitting: Podiatry

## 2018-10-09 ENCOUNTER — Ambulatory Visit: Payer: Self-pay | Admitting: Podiatry

## 2018-10-09 ENCOUNTER — Encounter: Payer: Self-pay | Admitting: Podiatry

## 2018-10-09 ENCOUNTER — Other Ambulatory Visit: Payer: Self-pay

## 2018-10-09 VITALS — BP 145/76 | HR 64 | Temp 96.2°F | Resp 16

## 2018-10-09 DIAGNOSIS — L03031 Cellulitis of right toe: Secondary | ICD-10-CM

## 2018-10-09 DIAGNOSIS — B351 Tinea unguium: Secondary | ICD-10-CM | POA: Diagnosis not present

## 2018-10-09 DIAGNOSIS — M79676 Pain in unspecified toe(s): Secondary | ICD-10-CM | POA: Diagnosis not present

## 2018-10-09 NOTE — Progress Notes (Signed)
Subjective:  Patient ID: Holly Koch, female    DOB: 04/20/1927,  MRN: [id number] HPI Chief Complaint  Patient presents with  . Toe Pain    Hallux right - red around toenail, tender  . New Patient (Initial Visit)    85 y.o. female presents with the above complaint.   ROS: She denies fever chills nausea vomiting muscle aches pains calf pain back pain chest pain shortness of breath.  Relates that the second toe on the right foot is been red for the past couple of weeks and is currently taking an antibiotic from her doctor.  Past Medical History:  Diagnosis Date  . Chest pain 3/13   Myoview stress test negative  . GERD (gastroesophageal reflux disease)   . Hyperlipidemia   . Hypertension   . Lung nodule    chronic, left  . Migraine   . OSA (obstructive sleep apnea)   . Osteoporosis   . Pre-syncope   . Raynaud's phenomenon    Past Surgical History:  Procedure Laterality Date  . ABDOMINAL HYSTERECTOMY    . APPENDECTOMY    . BREAST BIOPSY  1988   benign  . COLON SURGERY    . ESOPHAGEAL DILATION    . HEMORRHOID SURGERY  1961  . KNEE ARTHROSCOPY  1970   right knee  . PATELLA FRACTURE SURGERY  2007   repair of fractured right patella  . TONSILLECTOMY AND ADENOIDECTOMY  1936  . TUMOR EXCISION  1958   benign tumor  . UVULOPALATOPHARYNGOPLASTY  2001   for sleep apnea    Current Outpatient Medications:  .  aspirin 81 MG tablet, Take 81 mg by mouth daily., Disp: , Rfl:  .  cephALEXin (KEFLEX) 250 MG capsule, Take by mouth 3 (three) times daily., Disp: , Rfl:  .  Cholecalciferol (VITAMIN D3 PO), Take by mouth., Disp: , Rfl:  .  donepezil (ARICEPT) 10 MG tablet, TAKE ONE TABLET BY MOUTH AT BEDTIME. (DEMENTIA), Disp: 30 tablet, Rfl: 11 .  escitalopram (LEXAPRO) 10 MG tablet, Take 10 mg by mouth daily., Disp: , Rfl:  .  magnesium sulfate (EPSOM SALT) GRAN, Take by mouth as needed for mild constipation., Disp: , Rfl:  .  meclizine (ANTIVERT) 12.5 MG tablet, Take 12.5 mg  by mouth 3 (three) times daily as needed for dizziness., Disp: , Rfl:   Allergies  Allergen Reactions  . Melatonin     Causes bad dreams  . Rofecoxib     REACTION: makes feet and ankles swell  . Trazodone And Nefazodone Other (See Comments)    nightmares   Review of Systems Objective:   Vitals:   10/09/18 1156  BP: (!) 145/76  Pulse: 64  Resp: 16  Temp: (!) 96.2 F (35.7 C)    General: Well developed, nourished, in no acute distress, alert and oriented x3   Dermatological: Skin is warm, dry and supple bilateral. Nails x 10 are well maintained; remaining integument appears unremarkable at this time. There are no open sores, no preulcerative lesions, no rash or signs of infection present.  Mildly erythematous edematous swollen hallux right with a sharply rated nail margin fibular border hallux right.  No purulence no malodor mild tenderness on palpation.  I was able to resect the margin today alleviating her symptoms.  Vascular: Dorsalis Pedis artery and Posterior Tibial artery pedal pulses are 0/4 bilateral with immedate capillary fill time. Pedal hair growth present. No varicosities and no lower extremity edema present bilateral.   Neruologic: Grossly intact  via light touch bilateral. Vibratory intact via tuning fork bilateral. Protective threshold with Semmes Wienstein monofilament intact to all pedal sites bilateral. Patellar and Achilles deep tendon reflexes 2+ bilateral. No Babinski or clonus noted bilateral.   Musculoskeletal: No gross boney pedal deformities bilateral. No pain, crepitus, or limitation noted with foot and ankle range of motion bilateral. Muscular strength 5/5 in all groups tested bilateral.  Gait: Unassisted, Nonantalgic.    Radiographs:  None taken  Assessment & Plan:   Assessment: Mild ingrown nail paronychia  Plan: Discussed etiology pathology conservative surgical therapies.  At this point performed a toenail trim to take out the border.  She does  not have really good pulses so at this point I would rather not perform a full matrixectomy or I&D.  She will continue her antibiotics soak twice daily Epson salts and warm water cover with a Band-Aid.  Follow-up with Dr. Sharyon Cable if necessary.     [employer], [place]

## 2018-11-08 DIAGNOSIS — K219 Gastro-esophageal reflux disease without esophagitis: Secondary | ICD-10-CM | POA: Diagnosis not present

## 2018-11-08 DIAGNOSIS — G309 Alzheimer's disease, unspecified: Secondary | ICD-10-CM | POA: Diagnosis not present

## 2018-11-08 DIAGNOSIS — F39 Unspecified mood [affective] disorder: Secondary | ICD-10-CM | POA: Diagnosis not present

## 2018-11-08 DIAGNOSIS — R42 Dizziness and giddiness: Secondary | ICD-10-CM | POA: Diagnosis not present

## 2018-11-08 DIAGNOSIS — I10 Essential (primary) hypertension: Secondary | ICD-10-CM | POA: Diagnosis not present

## 2018-12-18 DIAGNOSIS — L03039 Cellulitis of unspecified toe: Secondary | ICD-10-CM

## 2019-01-08 DIAGNOSIS — G309 Alzheimer's disease, unspecified: Secondary | ICD-10-CM | POA: Diagnosis not present

## 2019-01-08 DIAGNOSIS — I10 Essential (primary) hypertension: Secondary | ICD-10-CM | POA: Diagnosis not present

## 2019-01-08 DIAGNOSIS — R42 Dizziness and giddiness: Secondary | ICD-10-CM

## 2019-01-08 DIAGNOSIS — I330 Acute and subacute infective endocarditis: Secondary | ICD-10-CM

## 2019-01-08 DIAGNOSIS — F39 Unspecified mood [affective] disorder: Secondary | ICD-10-CM | POA: Diagnosis not present

## 2019-01-08 DIAGNOSIS — K219 Gastro-esophageal reflux disease without esophagitis: Secondary | ICD-10-CM | POA: Diagnosis not present

## 2019-01-09 ENCOUNTER — Encounter: Payer: Self-pay | Admitting: Podiatry

## 2019-01-09 ENCOUNTER — Ambulatory Visit (INDEPENDENT_AMBULATORY_CARE_PROVIDER_SITE_OTHER): Payer: Medicare Other | Admitting: Podiatry

## 2019-01-09 ENCOUNTER — Other Ambulatory Visit: Payer: Self-pay

## 2019-01-09 DIAGNOSIS — B351 Tinea unguium: Secondary | ICD-10-CM | POA: Diagnosis not present

## 2019-01-09 DIAGNOSIS — M79676 Pain in unspecified toe(s): Secondary | ICD-10-CM | POA: Diagnosis not present

## 2019-01-09 NOTE — Progress Notes (Signed)
Complaint:  Visit Type: Patient returns to my office for continued preventative foot care services. Complaint: Patient states" my big toenail has grown long and thick and become painful to walk and wear shoes"  The patient presents for preventative foot care services. No changes to ROS  Podiatric Exam: Vascular: dorsalis pedis and posterior tibial pulses are not  palpable bilateral. Capillary return is immediate. Temperature gradient is WNL. Skin turgor WNL  Sensorium: Normal Semmes Weinstein monofilament test. Normal tactile sensation bilaterally. Nail Exam: Pt has thick disfigured discolored nails with subungual debris noted bilateral entire nail hallux through fifth toenails, especially right hallux. Ulcer Exam: There is no evidence of ulcer or pre-ulcerative changes or infection. Orthopedic Exam: Muscle tone and strength are WNL. No limitations in general ROM. No crepitus or effusions noted. Foot type and digits show no abnormalities. Bony prominences are unremarkable. Skin: No Porokeratosis. No infection or ulcers  Diagnosis:  Onychomycosis, , Pain in right toe, pain in left toes  Treatment & Plan Procedures and Treatment: Consent by patient was obtained for treatment procedures.   Debridement of mycotic and hypertrophic toenails, 1 through 5 bilateral and clearing of subungual debris. No ulceration, no infection noted.  Return Visit-Office Procedure: Patient instructed to return to the office for a follow up visit 3 months for continued evaluation and treatment.    Gardiner Barefoot DPM

## 2019-02-05 DIAGNOSIS — R4182 Altered mental status, unspecified: Secondary | ICD-10-CM | POA: Diagnosis not present

## 2019-02-05 DIAGNOSIS — N390 Urinary tract infection, site not specified: Secondary | ICD-10-CM | POA: Diagnosis not present

## 2019-02-06 DIAGNOSIS — R4182 Altered mental status, unspecified: Secondary | ICD-10-CM | POA: Diagnosis not present

## 2019-02-07 DIAGNOSIS — R4182 Altered mental status, unspecified: Secondary | ICD-10-CM | POA: Diagnosis not present

## 2019-03-05 DIAGNOSIS — I38 Endocarditis, valve unspecified: Secondary | ICD-10-CM

## 2019-03-05 DIAGNOSIS — F39 Unspecified mood [affective] disorder: Secondary | ICD-10-CM | POA: Diagnosis not present

## 2019-03-05 DIAGNOSIS — I10 Essential (primary) hypertension: Secondary | ICD-10-CM | POA: Diagnosis not present

## 2019-03-05 DIAGNOSIS — K219 Gastro-esophageal reflux disease without esophagitis: Secondary | ICD-10-CM | POA: Diagnosis not present

## 2019-03-05 DIAGNOSIS — R42 Dizziness and giddiness: Secondary | ICD-10-CM | POA: Diagnosis not present

## 2019-03-05 DIAGNOSIS — G309 Alzheimer's disease, unspecified: Secondary | ICD-10-CM | POA: Diagnosis not present

## 2019-05-08 DIAGNOSIS — M79674 Pain in right toe(s): Secondary | ICD-10-CM | POA: Diagnosis not present

## 2019-05-09 DIAGNOSIS — G309 Alzheimer's disease, unspecified: Secondary | ICD-10-CM | POA: Diagnosis not present

## 2019-05-09 DIAGNOSIS — I10 Essential (primary) hypertension: Secondary | ICD-10-CM | POA: Diagnosis not present

## 2019-05-09 DIAGNOSIS — K219 Gastro-esophageal reflux disease without esophagitis: Secondary | ICD-10-CM | POA: Diagnosis not present

## 2019-05-09 DIAGNOSIS — F39 Unspecified mood [affective] disorder: Secondary | ICD-10-CM | POA: Diagnosis not present

## 2019-05-11 DIAGNOSIS — Z23 Encounter for immunization: Secondary | ICD-10-CM | POA: Diagnosis not present

## 2019-06-08 DIAGNOSIS — Z23 Encounter for immunization: Secondary | ICD-10-CM | POA: Diagnosis not present

## 2019-07-02 DIAGNOSIS — K219 Gastro-esophageal reflux disease without esophagitis: Secondary | ICD-10-CM | POA: Diagnosis not present

## 2019-07-02 DIAGNOSIS — F39 Unspecified mood [affective] disorder: Secondary | ICD-10-CM | POA: Diagnosis not present

## 2019-07-02 DIAGNOSIS — I10 Essential (primary) hypertension: Secondary | ICD-10-CM | POA: Diagnosis not present

## 2019-07-02 DIAGNOSIS — G309 Alzheimer's disease, unspecified: Secondary | ICD-10-CM | POA: Diagnosis not present

## 2019-07-30 DIAGNOSIS — R4182 Altered mental status, unspecified: Secondary | ICD-10-CM | POA: Diagnosis not present

## 2019-08-29 DIAGNOSIS — B351 Tinea unguium: Secondary | ICD-10-CM | POA: Diagnosis not present

## 2019-09-01 DIAGNOSIS — I10 Essential (primary) hypertension: Secondary | ICD-10-CM | POA: Diagnosis not present

## 2019-09-05 DIAGNOSIS — G309 Alzheimer's disease, unspecified: Secondary | ICD-10-CM | POA: Diagnosis not present

## 2019-09-05 DIAGNOSIS — K219 Gastro-esophageal reflux disease without esophagitis: Secondary | ICD-10-CM | POA: Diagnosis not present

## 2019-09-05 DIAGNOSIS — F39 Unspecified mood [affective] disorder: Secondary | ICD-10-CM | POA: Diagnosis not present

## 2019-09-05 DIAGNOSIS — J3089 Other allergic rhinitis: Secondary | ICD-10-CM

## 2019-09-05 DIAGNOSIS — I10 Essential (primary) hypertension: Secondary | ICD-10-CM | POA: Diagnosis not present

## 2019-10-29 DIAGNOSIS — B351 Tinea unguium: Secondary | ICD-10-CM | POA: Diagnosis not present

## 2019-11-05 DIAGNOSIS — K219 Gastro-esophageal reflux disease without esophagitis: Secondary | ICD-10-CM | POA: Diagnosis not present

## 2019-11-05 DIAGNOSIS — I10 Essential (primary) hypertension: Secondary | ICD-10-CM | POA: Diagnosis not present

## 2019-11-05 DIAGNOSIS — G309 Alzheimer's disease, unspecified: Secondary | ICD-10-CM | POA: Diagnosis not present

## 2019-11-05 DIAGNOSIS — F39 Unspecified mood [affective] disorder: Secondary | ICD-10-CM | POA: Diagnosis not present

## 2019-11-24 DIAGNOSIS — L03115 Cellulitis of right lower limb: Secondary | ICD-10-CM | POA: Diagnosis not present

## 2019-12-10 DIAGNOSIS — R229 Localized swelling, mass and lump, unspecified: Secondary | ICD-10-CM | POA: Diagnosis not present

## 2019-12-12 DIAGNOSIS — N9069 Other specified hypertrophy of vulva: Secondary | ICD-10-CM | POA: Diagnosis not present

## 2020-01-02 DIAGNOSIS — F39 Unspecified mood [affective] disorder: Secondary | ICD-10-CM | POA: Diagnosis not present

## 2020-01-02 DIAGNOSIS — I10 Essential (primary) hypertension: Secondary | ICD-10-CM | POA: Diagnosis not present

## 2020-01-02 DIAGNOSIS — K219 Gastro-esophageal reflux disease without esophagitis: Secondary | ICD-10-CM | POA: Diagnosis not present

## 2020-01-02 DIAGNOSIS — B351 Tinea unguium: Secondary | ICD-10-CM | POA: Diagnosis not present

## 2020-01-02 DIAGNOSIS — G309 Alzheimer's disease, unspecified: Secondary | ICD-10-CM | POA: Diagnosis not present

## 2020-01-14 DIAGNOSIS — B351 Tinea unguium: Secondary | ICD-10-CM | POA: Diagnosis not present

## 2020-01-14 DIAGNOSIS — L539 Erythematous condition, unspecified: Secondary | ICD-10-CM | POA: Diagnosis not present

## 2020-01-14 DIAGNOSIS — M79674 Pain in right toe(s): Secondary | ICD-10-CM | POA: Diagnosis not present

## 2020-03-05 DIAGNOSIS — B351 Tinea unguium: Secondary | ICD-10-CM | POA: Diagnosis not present

## 2020-05-05 DIAGNOSIS — G309 Alzheimer's disease, unspecified: Secondary | ICD-10-CM | POA: Diagnosis not present

## 2020-05-05 DIAGNOSIS — K219 Gastro-esophageal reflux disease without esophagitis: Secondary | ICD-10-CM | POA: Diagnosis not present

## 2020-05-05 DIAGNOSIS — I10 Essential (primary) hypertension: Secondary | ICD-10-CM | POA: Diagnosis not present

## 2020-05-05 DIAGNOSIS — M1990 Unspecified osteoarthritis, unspecified site: Secondary | ICD-10-CM | POA: Diagnosis not present

## 2020-05-07 DIAGNOSIS — B351 Tinea unguium: Secondary | ICD-10-CM | POA: Diagnosis not present

## 2020-06-04 DIAGNOSIS — M25561 Pain in right knee: Secondary | ICD-10-CM | POA: Diagnosis not present

## 2020-06-09 DIAGNOSIS — M25561 Pain in right knee: Secondary | ICD-10-CM | POA: Diagnosis not present

## 2020-07-15 DIAGNOSIS — K219 Gastro-esophageal reflux disease without esophagitis: Secondary | ICD-10-CM | POA: Diagnosis not present

## 2020-07-15 DIAGNOSIS — M159 Polyosteoarthritis, unspecified: Secondary | ICD-10-CM | POA: Diagnosis not present

## 2020-07-15 DIAGNOSIS — I10 Essential (primary) hypertension: Secondary | ICD-10-CM | POA: Diagnosis not present

## 2020-07-15 DIAGNOSIS — G301 Alzheimer's disease with late onset: Secondary | ICD-10-CM | POA: Diagnosis not present

## 2020-07-23 DIAGNOSIS — B351 Tinea unguium: Secondary | ICD-10-CM | POA: Diagnosis not present

## 2020-09-08 DIAGNOSIS — K219 Gastro-esophageal reflux disease without esophagitis: Secondary | ICD-10-CM | POA: Diagnosis not present

## 2020-09-08 DIAGNOSIS — F39 Unspecified mood [affective] disorder: Secondary | ICD-10-CM | POA: Diagnosis not present

## 2020-09-08 DIAGNOSIS — G309 Alzheimer's disease, unspecified: Secondary | ICD-10-CM | POA: Diagnosis not present

## 2020-09-08 DIAGNOSIS — I10 Essential (primary) hypertension: Secondary | ICD-10-CM | POA: Diagnosis not present

## 2020-09-08 DIAGNOSIS — M1990 Unspecified osteoarthritis, unspecified site: Secondary | ICD-10-CM

## 2020-09-22 DIAGNOSIS — B351 Tinea unguium: Secondary | ICD-10-CM | POA: Diagnosis not present

## 2020-11-24 DIAGNOSIS — B351 Tinea unguium: Secondary | ICD-10-CM

## 2020-11-29 DIAGNOSIS — L03115 Cellulitis of right lower limb: Secondary | ICD-10-CM | POA: Diagnosis not present

## 2020-12-01 ENCOUNTER — Other Ambulatory Visit: Payer: Self-pay | Admitting: Orthopedic Surgery

## 2020-12-06 ENCOUNTER — Encounter
Admission: RE | Admit: 2020-12-06 | Discharge: 2020-12-06 | Disposition: A | Discharge status: Home / Self Care | Payer: Medicare Other | Source: Ambulatory Visit | Attending: Orthopedic Surgery | Admitting: Orthopedic Surgery

## 2020-12-06 ENCOUNTER — Other Ambulatory Visit: Payer: Self-pay

## 2020-12-06 NOTE — Patient Instructions (Addendum)
Your procedure is scheduled on: 12/09/2020 Report to the Registration Desk on the 1st floor of the Medical Mall. To find out your arrival time, please call [phone] between 1PM - 3PM on: 12/08/2020   REMEMBER: Instructions that are not followed completely may result in serious medical risk, up to and including death; or upon the discretion of your surgeon and anesthesiologist your surgery may need to be rescheduled.  Do not eat food after midnight the night before surgery.  No gum chewing, lozengers or hard candies.  You may however, drink CLEAR liquids up to 2 hours before you are scheduled to arrive for your surgery. Do not drink anything within 2 hours of your scheduled arrival time.  Clear liquids include: - water  - apple juice without pulp - gatorade (not RED, PURPLE, OR BLUE) - black coffee or tea (Do NOT add milk or creamers to the coffee or tea) Do NOT drink anything that is not on this list.    In addition, your doctor has ordered for you to drink the provided  Ensure Pre-Surgery Clear Carbohydrate Drink -provided for you  Drinking this carbohydrate drink up to two hours before surgery helps to reduce insulin resistance and improve patient outcomes. Please complete drinking 2 hours prior to scheduled arrival time.  TAKE THESE MEDICATIONS THE MORNING OF SURGERY WITH A SIP OF WATER only : NO APPLE SAUCE ,etc.....!!!!!  Pepcid Tylenol if needed    (take one the night before and one on the morning of surgery - helps to prevent nausea after surgery.)     One week prior to surgery: Stop Anti-inflammatories (NSAIDS) such as Advil, Aleve, Ibuprofen, Motrin, Naproxen, Naprosyn and Aspirin based products such as Excedrin, Goodys Powder, BC Powder. Stop ANY OVER THE COUNTER supplements until after surgery. You may however, continue to take Tylenol if needed for pain up until the day of surgery.C  She may continue colace, claritin and miralax until a day prior to surgery.  Hold on day of surgery.  No Alcohol for 24 hours before or after surgery.  No Smoking including e-cigarettes for 24 hours prior to surgery.  No chewable tobacco products for at least 6 hours prior to surgery.  No nicotine patches on the day of surgery.  Do not use any "recreational" drugs for at least a week prior to your surgery.  Please be advised that the combination of cocaine and anesthesia may have negative outcomes, up to and including death. If you test positive for cocaine, your surgery will be cancelled.  On the morning of surgery brush your teeth with toothpaste and water, you may rinse your mouth with mouthwash if you wish. Do not swallow any toothpaste or mouthwash.  Do not wear jewelry, make-up, hairpins, clips or nail polish.  Do not wear lotions, powders, or perfumes.   Do not shave body from the neck down 48 hours prior to surgery just in case you cut yourself which could leave a site for infection.  Also, freshly shaved skin may become irritated if using the CHG soap/wipes  Contact lenses, hearing aids and dentures may not be worn into surgery.  Do not bring valuables to the hospital. [facility] is not responsible for any missing/lost belongings or valuables.   Use CHG wipes as directed . Night before surgery and morning of surgery.       2 packs (6 cloths) provided. Use one cloth each limb. (1)Right and (2) left arm, (3) Right and (4)left leg, (5) neck to  abdomen area, and (6) back area.     Notify your doctor if there is any change in your medical condition (cold, fever, infection).  Wear comfortable clothing (specific to your surgery type) to the hospital.  After surgery, you can help prevent lung complications by doing breathing exercises.  Take deep breaths and cough every 1-2 hours. Your doctor may order a device called an Incentive Spirometer to help you take deep breaths.  If you are being admitted to the hospital overnight, leave your suitcase in the  car. After surgery it may be brought to your room.  If you are being discharged the day of surgery, you will not be allowed to drive home. You will need a responsible adult (18 years or older) to drive you home and stay with you that night.   If you are taking public transportation, you will need to have a responsible adult (18 years or older) with you. Please confirm with your physician that it is acceptable to use public transportation.   Please call the Pre-admissions Testing Dept. at [phone] if you have any questions about these instructions.  Surgery Visitation Policy:  Patients undergoing a surgery or procedure may have one family member or support person with them as long as that person is not COVID-19 positive or experiencing its symptoms.  That person may remain in the waiting area during the procedure.  Inpatient Visitation:    Visiting hours are 7 a.m. to 8 p.m. Inpatients will be allowed two visitors daily. The visitors may change each day during the patient's stay. No visitors under the age of 9. Any visitor under the age of 73 must be accompanied by an adult. The visitor must pass COVID-19 screenings, use hand sanitizer when entering and exiting the patient's room and wear a mask at all times, including in the patient's room. Patients must also wear a mask when staff or their visitor are in the room. Masking is required regardless of vaccination status.

## 2020-12-06 NOTE — Pre-Procedure Instructions (Addendum)
Instructions given to Holly Koch and Darl Pikes staff nurse at [facility] regarding lab work and EKG needed tomorrow 12/07/2020 [facility] will transport Pt. to pre -admit testing for lab work. The Koch will meet patient at the hospital on day of surgery to sign consent and for pre-op.   Koch's contact number is [phone] [facility] number is [phone].

## 2020-12-07 ENCOUNTER — Encounter
Admission: RE | Admit: 2020-12-07 | Discharge: 2020-12-07 | Disposition: A | Discharge status: Home / Self Care | Payer: Medicare Other | Source: Ambulatory Visit | Attending: Orthopedic Surgery | Admitting: Orthopedic Surgery

## 2020-12-07 ENCOUNTER — Encounter: Payer: Self-pay | Admitting: Urgent Care

## 2020-12-07 DIAGNOSIS — Z01818 Encounter for other preprocedural examination: Secondary | ICD-10-CM | POA: Diagnosis present

## 2020-12-07 DIAGNOSIS — Z01810 Encounter for preprocedural cardiovascular examination: Secondary | ICD-10-CM

## 2020-12-07 LAB — BASIC METABOLIC PANEL
Anion gap: 11 (ref 5–15)
BUN: 10 mg/dL (ref 8–23)
CO2: 24 mmol/L (ref 22–32)
Calcium: 9.3 mg/dL (ref 8.9–10.3)
Chloride: 104 mmol/L (ref 98–111)
Creatinine, Ser: 0.76 mg/dL (ref 0.44–1.00)
GFR, Estimated: >60 mL/min (ref >60)
Glucose, Bld: 97 mg/dL (ref 70–99)
Potassium: 3.8 mmol/L (ref 3.5–5.1)
Sodium: 139 mmol/L (ref 135–145)

## 2020-12-07 LAB — CBC
HCT: 35 % — ABNORMAL LOW (ref 36.0–46.0)
Hemoglobin: 10.3 g/dL — ABNORMAL LOW (ref 12.0–15.0)
MCH: 23.6 pg — ABNORMAL LOW (ref 26.0–34.0)
MCHC: 29.4 g/dL — ABNORMAL LOW (ref 30.0–36.0)
MCV: 80.1 fL (ref 80.0–100.0)
Platelets: 279 10*3/uL (ref 150–400)
RBC: 4.37 MIL/uL (ref 3.87–5.11)
RDW: 17.8 % — ABNORMAL HIGH (ref 11.5–15.5)
WBC: 8 10*3/uL (ref 4.0–10.5)
nRBC: 0 % (ref 0.0–0.2)

## 2020-12-09 ENCOUNTER — Ambulatory Visit: Payer: Medicare Other | Admitting: Anesthesiology

## 2020-12-09 ENCOUNTER — Ambulatory Visit
Admission: RE | Admit: 2020-12-09 | Discharge: 2020-12-09 | Disposition: A | Discharge status: Home / Self Care | Payer: Medicare Other | Attending: Orthopedic Surgery | Admitting: Orthopedic Surgery

## 2020-12-09 ENCOUNTER — Encounter: Payer: Self-pay | Admitting: Orthopedic Surgery

## 2020-12-09 ENCOUNTER — Other Ambulatory Visit: Payer: Self-pay

## 2020-12-09 ENCOUNTER — Encounter
Admission: RE | Disposition: A | Discharge status: Home / Self Care | Payer: Self-pay | Source: Home / Self Care | Attending: Orthopedic Surgery

## 2020-12-09 DIAGNOSIS — T84498A Other mechanical complication of other internal orthopedic devices, implants and grafts, initial encounter: Secondary | ICD-10-CM | POA: Diagnosis present

## 2020-12-09 DIAGNOSIS — Y831 Surgical operation with implant of artificial internal device as the cause of abnormal reaction of the patient, or of later complication, without mention of misadventure at the time of the procedure: Secondary | ICD-10-CM | POA: Insufficient documentation

## 2020-12-09 DIAGNOSIS — Z419 Encounter for procedure for purposes other than remedying health state, unspecified: Secondary | ICD-10-CM

## 2020-12-09 DIAGNOSIS — G309 Alzheimer's disease, unspecified: Secondary | ICD-10-CM | POA: Diagnosis not present

## 2020-12-09 DIAGNOSIS — Z8673 Personal history of transient ischemic attack (TIA), and cerebral infarction without residual deficits: Secondary | ICD-10-CM | POA: Diagnosis not present

## 2020-12-09 DIAGNOSIS — F0280 Dementia in other diseases classified elsewhere without behavioral disturbance: Secondary | ICD-10-CM | POA: Insufficient documentation

## 2020-12-09 DIAGNOSIS — T84298A Other mechanical complication of internal fixation device of other bones, initial encounter: Secondary | ICD-10-CM | POA: Insufficient documentation

## 2020-12-09 DIAGNOSIS — Z79899 Other long term (current) drug therapy: Secondary | ICD-10-CM | POA: Diagnosis not present

## 2020-12-09 HISTORY — PX: HARDWARE REMOVAL: SHX979

## 2020-12-09 SURGERY — REMOVAL, HARDWARE
Anesthesia: Monitor Anesthesia Care | Laterality: Right

## 2020-12-09 MED ORDER — PROPOFOL 10 MG/ML IV BOLUS
INTRAVENOUS | Status: DC | PRN
  Administered 2020-12-09: 10 mg via INTRAVENOUS

## 2020-12-09 MED ORDER — LACTATED RINGERS IV SOLN: INTRAVENOUS | Status: DC

## 2020-12-09 MED ORDER — PROPOFOL 500 MG/50ML IV EMUL
INTRAVENOUS | Status: DC | PRN
  Administered 2020-12-09: 50 ug/kg/min via INTRAVENOUS

## 2020-12-09 MED ORDER — CHLORHEXIDINE GLUCONATE 0.12 % MT SOLN: 15.0000 mL | Freq: Once | OROMUCOSAL | Status: AC

## 2020-12-09 MED ORDER — LIDOCAINE HCL (CARDIAC) PF 100 MG/5ML IV SOSY
PREFILLED_SYRINGE | INTRAVENOUS | Status: DC | PRN
  Administered 2020-12-09: 50 mg via INTRATRACHEAL

## 2020-12-09 MED ORDER — DEXMEDETOMIDINE (PRECEDEX) IN NS 20 MCG/5ML (4 MCG/ML) IV SYRINGE
PREFILLED_SYRINGE | INTRAVENOUS | Status: DC | PRN
  Administered 2020-12-09: 4 ug via INTRAVENOUS

## 2020-12-09 MED ORDER — ORAL CARE MOUTH RINSE: 15.0000 mL | Freq: Once | OROMUCOSAL | Status: AC

## 2020-12-09 MED ORDER — BUPIVACAINE HCL (PF) 0.5 % IJ SOLN
INTRAMUSCULAR | Status: DC | PRN
  Administered 2020-12-09: 10 mL

## 2020-12-09 MED ORDER — PROPOFOL 10 MG/ML IV BOLUS
INTRAVENOUS | Status: AC
  Filled 2020-12-09: qty 20

## 2020-12-09 MED ORDER — NEOMYCIN-POLYMYXIN B GU 40-200000 IR SOLN
Status: AC
  Filled 2020-12-09: qty 4

## 2020-12-09 MED ORDER — CEFAZOLIN SODIUM-DEXTROSE 2-4 GM/100ML-% IV SOLN
INTRAVENOUS | Status: AC
  Filled 2020-12-09: qty 100

## 2020-12-09 MED ORDER — CEFAZOLIN SODIUM-DEXTROSE 2-4 GM/100ML-% IV SOLN
2.0000 g | INTRAVENOUS | Status: AC
  Administered 2020-12-09: 2 g via INTRAVENOUS

## 2020-12-09 MED ORDER — 0.9 % SODIUM CHLORIDE (POUR BTL) OPTIME
TOPICAL | Status: DC | PRN
  Administered 2020-12-09: 500 mL

## 2020-12-09 MED ORDER — BUPIVACAINE HCL (PF) 0.5 % IJ SOLN
INTRAMUSCULAR | Status: AC
  Filled 2020-12-09: qty 30

## 2020-12-09 MED ORDER — CHLORHEXIDINE GLUCONATE 0.12 % MT SOLN
OROMUCOSAL | Status: AC
  Administered 2020-12-09: 15 mL via OROMUCOSAL
  Filled 2020-12-09: qty 15

## 2020-12-09 MED ORDER — DEXMEDETOMIDINE HCL IN NACL 200 MCG/50ML IV SOLN
INTRAVENOUS | Status: AC
  Filled 2020-12-09: qty 50

## 2020-12-09 SURGICAL SUPPLY — 38 items
CANISTER SUCT 1200ML W/VALVE (MISCELLANEOUS) ×2 IMPLANT
CHLORAPREP W/TINT 26 (MISCELLANEOUS) ×2 IMPLANT
CUFF TOURN SGL QUICK 24 (TOURNIQUET CUFF)
CUFF TOURN SGL QUICK 34 (TOURNIQUET CUFF)
CUFF TRNQT CYL 24X4X16.5-23 (TOURNIQUET CUFF) IMPLANT
CUFF TRNQT CYL 34X4.125X (TOURNIQUET CUFF) IMPLANT
DRAPE C-ARM XRAY 36X54 (DRAPES) ×2 IMPLANT
DRAPE INCISE IOBAN 66X45 STRL (DRAPES) ×2 IMPLANT
DRSG EMULSION OIL 3X8 NADH (GAUZE/BANDAGES/DRESSINGS) ×2 IMPLANT
ELECT CAUTERY BLADE 6.4 (BLADE) ×2 IMPLANT
ELECT REM PT RETURN 9FT ADLT (ELECTROSURGICAL) ×2
ELECTRODE REM PT RTRN 9FT ADLT (ELECTROSURGICAL) ×1 IMPLANT
GAUZE 4X4 16PLY ~~LOC~~+RFID DBL (SPONGE) ×2 IMPLANT
GAUZE SPONGE 4X4 12PLY STRL (GAUZE/BANDAGES/DRESSINGS) ×2 IMPLANT
GAUZE XEROFORM 1X8 LF (GAUZE/BANDAGES/DRESSINGS) ×2 IMPLANT
GLOVE SURG SYN 9.0  PF PI (GLOVE) ×1
GLOVE SURG SYN 9.0 PF PI (GLOVE) ×1 IMPLANT
GOWN SRG 2XL LVL 4 RGLN SLV (GOWNS) ×1 IMPLANT
GOWN STRL NON-REIN 2XL LVL4 (GOWNS) ×1
GOWN STRL REUS W/ TWL LRG LVL3 (GOWN DISPOSABLE) ×1 IMPLANT
GOWN STRL REUS W/TWL LRG LVL3 (GOWN DISPOSABLE) ×1
KIT TURNOVER KIT A (KITS) ×2 IMPLANT
MANIFOLD NEPTUNE II (INSTRUMENTS) ×2 IMPLANT
NEEDLE FILTER BLUNT 18X 1/2SAF (NEEDLE) ×1
NEEDLE FILTER BLUNT 18X1 1/2 (NEEDLE) ×1 IMPLANT
NS IRRIG 1000ML POUR BTL (IV SOLUTION) ×2 IMPLANT
PACK EXTREMITY ARMC (MISCELLANEOUS) ×2 IMPLANT
PAD ABD DERMACEA PRESS 5X9 (GAUZE/BANDAGES/DRESSINGS) ×4 IMPLANT
SCALPEL PROTECTED #15 DISP (BLADE) ×4 IMPLANT
STAPLER SKIN PROX 35W (STAPLE) ×2 IMPLANT
SUT ETHIBOND NAB CT1 #1 30IN (SUTURE) ×2 IMPLANT
SUT ETHILON 3-0 FS-10 30 BLK (SUTURE) ×2
SUT VIC AB 0 CT1 36 (SUTURE) ×2 IMPLANT
SUT VIC AB 2-0 CT1 27 (SUTURE) ×1
SUT VIC AB 2-0 CT1 TAPERPNT 27 (SUTURE) ×1 IMPLANT
SUTURE EHLN 3-0 FS-10 30 BLK (SUTURE) ×1 IMPLANT
SYR 10ML LL (SYRINGE) ×2 IMPLANT
WATER STERILE IRR 1000ML POUR (IV SOLUTION) ×2 IMPLANT

## 2020-12-09 NOTE — Anesthesia Postprocedure Evaluation (Signed)
Anesthesia Post Note  Patient: Holly Koch  Procedure(s) Performed: Right knee patella hardware removal (Right)  Patient location during evaluation: PACU Anesthesia Type: MAC Level of consciousness: awake and alert and confused Pain management: pain level controlled Vital Signs Assessment: post-procedure vital signs reviewed and stable Respiratory status: spontaneous breathing, nonlabored ventilation and respiratory function stable Cardiovascular status: stable and blood pressure returned to baseline Postop Assessment: no apparent nausea or vomiting Anesthetic complications: no   No notable events documented.   Last Vitals:  Vitals:   12/09/20 1419 12/09/20 1422  BP:  (!) 163/53  Pulse: 68   Resp: 16   Temp:    SpO2: 100%     Last Pain:  Vitals:   12/09/20 1357  TempSrc: Temporal  PainSc: 0-No pain                 [place] Ouch

## 2020-12-09 NOTE — H&P (Signed)
Holly Koch, [place] - 11/29/2020 2:30 PM EDT Formatting of this note is different from the original. Images from the original note were not included. Chief Complaint: Chief Complaint  Patient presents with   Right knee pain  Has visible metal sticking out of her right knee   Holly Koch is a 87 y.o. female who presents today for exposed hardware to her right patella. In 2007 she underwent patella ORIF for comminuted patella fracture by Dr. Reita Chard. Has been doing well, over the last few days they have noticed exposed hardware along the anterior aspect of the patella. Patient has not had any warmth or redness or drainage. She has dementia and typically does not ambulate much anymore. Son states patient has been scratching at the area, she has been able to feel the hardware.  Past Medical History: Past Medical History:  Diagnosis Date   Alzheimer's disease (CMS-HCC)   Dermatitis   Endocarditis   Gastro-esophageal reflux   Hypertension   Lumbar degenerative disc disease   Mood disorder (CMS-HCC)   Osteoarthritis   Osteoporosis   Spinal stenosis   Past Surgical History: Past Surgical History:  Procedure Laterality Date   pattela surgery Right   Past Family History: No family history on file.  Medications: Current Outpatient Medications Ordered in Epic  Medication Sig Dispense Refill   donepeziL (ARICEPT) 10 MG tablet Take 10 mg by mouth once daily   famotidine (PEPCID) 20 MG tablet Take 20 mg by mouth nightly   hydrocortisone 2.5 % cream   loratadine (CLARITIN) 10 mg tablet TAKE 1 TABLET BY MOUTH EVERY DAY FOR ALLERGIES   multivitamin with minerals tablet Take 1 tablet by mouth once daily   NYAMYC 100,000 unit/gram powder   PAIN RELIEVER EXTRA STRENGTH 500 mg tablet Take 500 mg by mouth 3 (three) times daily   polyethylene glycol (MIRALAX) powder Take by mouth   STOOL SOFTENER 100 mg capsule Take 100 mg by mouth once daily   triamcinolone 0.1 % cream    No current Epic-ordered facility-administered medications on file.   Allergies: No Known Allergies   Review of Systems:  A comprehensive 14 point ROS was performed, reviewed by me today, and the pertinent orthopaedic findings are documented in the HPI.  Exam: BP 118/70  General:  Well developed, well nourished, no apparent distress, normal affect, presents in a wheelchair. Confused.  HEENT: Head normocephalic, atraumatic, PERRL.   Abdomen: Soft, non tender, non distended, Bowel sounds present.  Heart: Examination of the heart reveals regular, rate, and rhythm. There is no murmur noted on ascultation. There is a normal apical pulse.  Lungs: Lungs are clear to auscultation. There is no wheeze, rhonchi, or crackles. There is normal expansion of bilateral chest walls.   Right knee: 0 to 115 degrees range of motion. No laxity valgus varus stress testing. She is able to straight leg raise. Small 3 mm opening along the anterior patella with exposed metal hardware, appears to be abraded not from a figure-of-eight wire. Patient has no swelling warmth erythema edema or drainage. No effusion.  AP lateral sunrise views of the right knee are ordered interpreted by me in the office today. Impression: Patient has 2 K wires with figure-of-eight wire around the patella. No evidence of acute bony abnormality. Mild degenerative changes in the medial lateral compartment with mild chondrocalcinosis noted. Patella appears to be healed.  Impression: Exposed orthopaedic hardware (CMS-HCC) [A21.308M] Exposed orthopaedic hardware (CMS-HCC) right patella (primary encounter diagnosis)  Plan:  1.  87 year old female with history of patella ORIF in 2007. Over the last few days patient has had exposed hardware along the anterior aspect of the patella. Risks, benefits, complications of right patella anterior hardware removal have been discussed with the patient. Patient has agreed and consented the procedure  with Dr. Kennedy Bucker. We will plan to do this under local anesthesia. We will plan on removing the wire not only and not the remainder of the hardware.  This note was generated in part with voice recognition software and I apologize for any typographical errors that were not detected and corrected.  Holly Koch MPA-C   Electronically signed by Holly Musca, PA at 11/30/2020 3:33 PM EDT  Reviewed  H+P. No changes noted.

## 2020-12-09 NOTE — Anesthesia Preprocedure Evaluation (Addendum)
Anesthesia Evaluation  Patient identified by MRN, date of birth, ID band Patient awake and Patient confused    Reviewed: Allergy & Precautions, NPO status , Patient's Chart, lab work & pertinent test results  Airway Mallampati: II  TM Distance: >3 FB Neck ROM: full    Dental no notable dental hx.    Pulmonary neg pulmonary ROS,    Pulmonary exam normal        Cardiovascular Exercise Tolerance: Poor hypertension, Normal cardiovascular exam Rhythm:Regular  Raynaud's Phenomenon   Neuro/Psych PSYCHIATRIC DISORDERS Dementia TIA (Remote history)   GI/Hepatic Neg liver ROS, GERD  ,  Endo/Other  negative endocrine ROS  Renal/GU      Musculoskeletal  (+) Arthritis , Osteoarthritis,  Right knee patella hardware removal    Abdominal Normal abdominal exam  (+)   Peds  Hematology negative hematology ROS (+)   Anesthesia Other Findings Past Medical History: No date: GERD (gastroesophageal reflux disease) No date: Hyperlipidemia No date: Hypertension No date: Lung nodule     Comment:  chronic, left No date: Migraine No date: OSA (obstructive sleep apnea) No date: Osteoporosis No date: Pre-syncope No date: Raynaud's phenomenon  Past Surgical History: No date: ABDOMINAL HYSTERECTOMY No date: APPENDECTOMY 1988: BREAST BIOPSY     Comment:  benign No date: COLON SURGERY No date: ESOPHAGEAL DILATION 1961: HEMORRHOID SURGERY 1970: KNEE ARTHROSCOPY     Comment:  right knee 2007: PATELLA FRACTURE SURGERY     Comment:  repair of fractured right patella 1936: TONSILLECTOMY AND ADENOIDECTOMY 1958: TUMOR EXCISION     Comment:  benign tumor 2001: UVULOPALATOPHARYNGOPLASTY     Comment:  for sleep apnea  BMI    Body Mass Index: 26.32 kg/m      Reproductive/Obstetrics negative OB ROS                           Anesthesia Physical Anesthesia Plan  ASA: 3  Anesthesia Plan: MAC   Post-op Pain  Management:    Induction: Intravenous  PONV Risk Score and Plan: Treatment may vary due to age or medical condition and Propofol infusion  Airway Management Planned: Natural Airway and Nasal Cannula  Additional Equipment:   Intra-op Plan:   Post-operative Plan:   Informed Consent: I have reviewed the patients History and Physical, chart, labs and discussed the procedure including the risks, benefits and alternatives for the proposed anesthesia with the patient or authorized representative who has indicated his/her understanding and acceptance.     Dental Advisory Given  Plan Discussed with: Anesthesiologist, CRNA and Surgeon  Anesthesia Plan Comments:       Anesthesia Quick Evaluation

## 2020-12-09 NOTE — Transfer of Care (Signed)
Immediate Anesthesia Transfer of Care Note  Patient: Holly Koch  Procedure(s) Performed: Right knee patella hardware removal (Right)  Patient Location: PACU  Anesthesia Type:MAC  Level of Consciousness: awake and alert   Airway & Oxygen Therapy: Patient Spontanous Breathing and Patient connected to nasal cannula oxygen  Post-op Assessment: Report given to RN and Post -op Vital signs reviewed and stable  Post vital signs: Reviewed and stable  Last Vitals:  Vitals Value Taken Time  BP 140/54 12/09/20 1315  Temp    Pulse 65 12/09/20 1318  Resp 14 12/09/20 1318  SpO2 99 % 12/09/20 1318  Vitals shown include unvalidated device data.  Last Pain:  Vitals:   12/09/20 0926  TempSrc: Temporal  PainSc: 3          Complications: No notable events documented.

## 2020-12-09 NOTE — Discharge Instructions (Addendum)
AMBULATORY SURGERY  DISCHARGE INSTRUCTIONS   The drugs that you were given will stay in your system until tomorrow so for the next 24 hours you should not:  Drive an automobile Make any legal decisions Drink any alcoholic beverage   You may resume regular meals tomorrow.  Today it is better to start with liquids and gradually work up to solid foods.  You may eat anything you prefer, but it is better to start with liquids, then soup and crackers, and gradually work up to solid foods.   Please notify your doctor immediately if you have any unusual bleeding, trouble breathing, redness and pain at the surgery site, drainage, fever, or pain not relieved by medication.    Additional Instructions: NA    Please contact your physician with any problems or Same Day Surgery at [phone], Monday through Friday 6 am to 4 pm, or  at [facility] number at [phone].      Keep dressing clean and dry until return visit Activity as tolerated

## 2020-12-09 NOTE — Op Note (Signed)
12/09/2020  1:16 PM  PATIENT:  Holly Koch  87 y.o. female  PRE-OPERATIVE DIAGNOSIS:  Exposed orthopedic hardware T84.498A  POST-OPERATIVE DIAGNOSIS:  same  PROCEDURE:  Procedure(s): Right knee patella hardware removal (Right)  SURGEON: Leitha Schuller, MD  ASSISTANTS: None  ANESTHESIA:   local and IV sedation  EBL:  Total I/O In: 100 [IV Piggyback:100] Out: -   BLOOD ADMINISTERED:none  DRAINS: none   LOCAL MEDICATIONS USED:  MARCAINE     SPECIMEN:  No Specimen  DISPOSITION OF SPECIMEN:  N/A  COUNTS:  YES  TOURNIQUET:  * No tourniquets in log *  IMPLANTS: None  DICTATION: .Dragon Dictation patient brought the operating room with a tourniquet applied the upper thigh the leg was prepped and draped in usual sterile fashion with appropriate patient identification timeout procedure completed.  10 cc of half percent Sensorcaine plain were infiltrated around the area of the planned incision.  After this was allowed to set the prior transverse incision was opened medially and laterally with the exposed hardware elevated and then with a wire cutter cut off so that the knotted part was removed approximately a centimeter and a half of exposed wire such that it was completely under the skin after removal.  The proximal and distal extensions were closed using simple interrupted 4-0 nylon skin suture the open area appeared to be healthy and can granulate in.  Dressing of Xeroform 4 x 4 web roll and Ace wrap applied  PLAN OF CARE: Discharge to home after PACU  PATIENT DISPOSITION:  PACU - hemodynamically stable.

## 2020-12-10 ENCOUNTER — Encounter: Payer: Self-pay | Admitting: Orthopedic Surgery

## 2020-12-16 ENCOUNTER — Encounter: Payer: Self-pay | Admitting: Orthopedic Surgery

## 2021-01-07 DIAGNOSIS — G309 Alzheimer's disease, unspecified: Secondary | ICD-10-CM | POA: Diagnosis not present

## 2021-01-07 DIAGNOSIS — M1990 Unspecified osteoarthritis, unspecified site: Secondary | ICD-10-CM

## 2021-01-07 DIAGNOSIS — K219 Gastro-esophageal reflux disease without esophagitis: Secondary | ICD-10-CM

## 2021-01-07 DIAGNOSIS — I10 Essential (primary) hypertension: Secondary | ICD-10-CM | POA: Diagnosis not present

## 2021-01-07 DIAGNOSIS — Z472 Encounter for removal of internal fixation device: Secondary | ICD-10-CM

## 2021-01-07 DIAGNOSIS — F39 Unspecified mood [affective] disorder: Secondary | ICD-10-CM

## 2021-01-28 DIAGNOSIS — B351 Tinea unguium: Secondary | ICD-10-CM | POA: Diagnosis not present

## 2021-03-10 DIAGNOSIS — K219 Gastro-esophageal reflux disease without esophagitis: Secondary | ICD-10-CM | POA: Diagnosis not present

## 2021-03-10 DIAGNOSIS — G301 Alzheimer's disease with late onset: Secondary | ICD-10-CM | POA: Diagnosis not present

## 2021-03-10 DIAGNOSIS — M159 Polyosteoarthritis, unspecified: Secondary | ICD-10-CM | POA: Diagnosis not present

## 2021-03-10 DIAGNOSIS — I10 Essential (primary) hypertension: Secondary | ICD-10-CM | POA: Diagnosis not present

## 2021-04-01 DIAGNOSIS — B351 Tinea unguium: Secondary | ICD-10-CM | POA: Diagnosis not present

## 2021-05-04 DIAGNOSIS — F39 Unspecified mood [affective] disorder: Secondary | ICD-10-CM | POA: Diagnosis not present

## 2021-05-04 DIAGNOSIS — G309 Alzheimer's disease, unspecified: Secondary | ICD-10-CM | POA: Diagnosis not present

## 2021-05-04 DIAGNOSIS — I10 Essential (primary) hypertension: Secondary | ICD-10-CM | POA: Diagnosis not present

## 2021-05-04 DIAGNOSIS — K219 Gastro-esophageal reflux disease without esophagitis: Secondary | ICD-10-CM | POA: Diagnosis not present

## 2021-05-04 DIAGNOSIS — M1990 Unspecified osteoarthritis, unspecified site: Secondary | ICD-10-CM

## 2021-06-10 DIAGNOSIS — B351 Tinea unguium: Secondary | ICD-10-CM | POA: Diagnosis not present

## 2021-07-30 DEATH — deceased
